# Patient Record
Sex: Male | Born: 1990 | Race: Black or African American | Hispanic: No | Marital: Married | Smoking: Never smoker
Health system: Southern US, Community
[De-identification: ages and names within clinical notes are randomized; demographics above are authoritative.]

## PROBLEM LIST (undated history)

## (undated) DIAGNOSIS — I10 Essential (primary) hypertension: Secondary | ICD-10-CM

## (undated) DIAGNOSIS — E119 Type 2 diabetes mellitus without complications: Secondary | ICD-10-CM

## (undated) DIAGNOSIS — L02512 Cutaneous abscess of left hand: Secondary | ICD-10-CM

## (undated) HISTORY — DX: Cutaneous abscess of left hand: L02.512

---
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
## 2011-07-04 ENCOUNTER — Emergency Department (HOSPITAL_COMMUNITY)
Admission: EM | Admit: 2011-07-04 | Discharge: 2011-07-04 | Disposition: A | Discharge status: Home / Self Care | Payer: Commercial Indemnity | Attending: Emergency Medicine | Admitting: Emergency Medicine

## 2011-07-04 DIAGNOSIS — IMO0001 Reserved for inherently not codable concepts without codable children: Secondary | ICD-10-CM | POA: Insufficient documentation

## 2011-07-04 DIAGNOSIS — E119 Type 2 diabetes mellitus without complications: Secondary | ICD-10-CM | POA: Insufficient documentation

## 2011-07-04 DIAGNOSIS — R5383 Other fatigue: Secondary | ICD-10-CM | POA: Insufficient documentation

## 2011-07-04 DIAGNOSIS — R110 Nausea: Secondary | ICD-10-CM | POA: Insufficient documentation

## 2011-07-04 DIAGNOSIS — B9789 Other viral agents as the cause of diseases classified elsewhere: Secondary | ICD-10-CM | POA: Insufficient documentation

## 2011-07-04 DIAGNOSIS — I10 Essential (primary) hypertension: Secondary | ICD-10-CM | POA: Insufficient documentation

## 2011-07-04 DIAGNOSIS — R5381 Other malaise: Secondary | ICD-10-CM | POA: Insufficient documentation

## 2011-07-04 DIAGNOSIS — Z79899 Other long term (current) drug therapy: Secondary | ICD-10-CM | POA: Insufficient documentation

## 2011-07-04 LAB — URINE MICROSCOPIC-ADD ON

## 2011-07-04 LAB — DIFFERENTIAL
Basophils Absolute: 0 10*3/uL (ref 0.0–0.1)
Basophils Relative: 0 % (ref 0–1)
Monocytes Relative: 6 % (ref 3–12)
Neutro Abs: 6.8 10*3/uL (ref 1.7–7.7)
Neutrophils Relative %: 64 % (ref 43–77)

## 2011-07-04 LAB — BASIC METABOLIC PANEL
CO2: 25 mEq/L (ref 19–32)
Chloride: 100 mEq/L (ref 96–112)
Glucose, Bld: 147 mg/dL — ABNORMAL HIGH (ref 70–99)
Potassium: 3.9 mEq/L (ref 3.5–5.1)
Sodium: 136 mEq/L (ref 135–145)

## 2011-07-04 LAB — URINALYSIS, ROUTINE W REFLEX MICROSCOPIC
Glucose, UA: >1000 mg/dL — AB
Hgb urine dipstick: NEGATIVE
Specific Gravity, Urine: 1.034 — ABNORMAL HIGH (ref 1.005–1.030)
pH: 6 (ref 5.0–8.0)

## 2011-07-04 LAB — CBC
Hemoglobin: 16.3 g/dL (ref 13.0–17.0)
MCH: 30.4 pg (ref 26.0–34.0)
RBC: 5.37 MIL/uL (ref 4.22–5.81)

## 2013-05-13 ENCOUNTER — Encounter (HOSPITAL_COMMUNITY): Payer: Self-pay | Admitting: Emergency Medicine

## 2013-05-13 ENCOUNTER — Emergency Department (HOSPITAL_COMMUNITY)
Admission: EM | Admit: 2013-05-13 | Discharge: 2013-05-13 | Disposition: A | Discharge status: Home / Self Care | Payer: Commercial Indemnity | Source: Home / Self Care

## 2013-05-13 DIAGNOSIS — K648 Other hemorrhoids: Secondary | ICD-10-CM

## 2013-05-13 HISTORY — DX: Type 2 diabetes mellitus without complications: E11.9

## 2013-05-13 HISTORY — DX: Essential (primary) hypertension: I10

## 2013-05-13 MED ORDER — HYDROCORTISONE ACETATE 25 MG RE SUPP: 25.0000 mg | Freq: Two times a day (BID) | RECTAL | Status: DC

## 2013-05-13 NOTE — ED Notes (Signed)
Pt c/o rectal bleeding onset this am... Reports he noticed blood when he wiped; denies blood in the stool or toilet bowel... Also denies abd pain and no hx of hemorrhoids... Has noticed he has been constipated lately... Alert w/no signs of acute distress.

## 2013-05-13 NOTE — ED Provider Notes (Signed)
  CSN: [id number]     Arrival date & time 05/13/13  1349 History     First MD Initiated Contact with Patient 05/13/13 1407     Chief Complaint  Patient presents with  . Rectal Bleeding   (Consider location/radiation/quality/duration/timing/severity/associated sxs/prior Treatment) HPI Comments: St when wiping after BM today saw BRBPR on tissue. No pain. Has been constipating and straining. Denies abdominal pain and admits to a poor, low fiber diet   Past Medical History  Diagnosis Date  . Hypertension   . Diabetes mellitus without complication    History reviewed. No pertinent past surgical history. No family history on file. History  Substance Use Topics  . Smoking status: Never Smoker   . Smokeless tobacco: Not on file  . Alcohol Use: Yes    Review of Systems  Constitutional: Negative.   Respiratory: Negative.   Gastrointestinal: Positive for constipation and anal bleeding. Negative for nausea, vomiting, abdominal pain, diarrhea, blood in stool and rectal pain.  Genitourinary: Negative.     Allergies  Penicillins  Home Medications   Current Outpatient Rx  Name  Route  Sig  Dispense  Refill  . insulin detemir (LEVEMIR) 100 UNIT/ML injection   Subcutaneous   Inject into the skin at bedtime.         . insulin lispro (HUMALOG KWIKPEN) 100 UNIT/ML SOPN   Subcutaneous   Inject into the skin.         Marland Kitchen lisinopril (PRINIVIL,ZESTRIL) 5 MG tablet   Oral   Take 5 mg by mouth daily.         . hydrocortisone (ANUSOL-HC) 25 MG suppository   Rectal   Place 1 suppository (25 mg total) rectally 2 (two) times daily.   24 suppository   1    There were no vitals taken for this visit. Physical Exam  Nursing note and vitals reviewed. Constitutional: He is oriented to person, place, and time. He appears well-developed and well-nourished. No distress.  Eyes: Conjunctivae and EOM are normal.  Neck: Normal range of motion. Neck supple.  Pulmonary/Chest: Effort normal.   Genitourinary:  Rectal with no external lesions or bleeding.  Nl sphincter tone Internal/DRE reveals 2 cord-like structures that are tender running parallel to the rectum and are tender.  There is scant blood in the rectal vault, no stool.   Musculoskeletal: He exhibits no edema and no tenderness.  Neurological: He is alert and oriented to person, place, and time. He exhibits normal muscle tone.  Skin: Skin is warm and dry. No rash noted.  Psychiatric: He has a normal mood and affect.    ED Course   Procedures (including critical care time)  Labs Reviewed - No data to display No results found. 1. Internal bleeding hemorrhoids     MDM  Use MiraLax as directed for constipation. Limit the use for only what he needed. Colace stool softener 3 times a day Drink plenty of fluids and stay away from the fast foods, greasy foods and starchy foods. Read instructions on diet for hemorrhoids. Anusol a.c. suppositories twice a day No more straining.  Hayden Rasmussen, NP 05/13/13 [phone]

## 2013-05-13 NOTE — ED Provider Notes (Signed)
Medical screening examination/treatment/procedure(s) were performed by resident physician or non-physician practitioner and as supervising physician I was immediately available for consultation/collaboration.   Barkley Bruns MD.   Linna Hoff, MD 05/13/13 2018

## 2014-09-04 ENCOUNTER — Ambulatory Visit (INDEPENDENT_AMBULATORY_CARE_PROVIDER_SITE_OTHER): Payer: 59 | Admitting: Physician Assistant

## 2014-09-04 VITALS — BP 138/92 | HR 94 | Temp 98.4°F | Resp 18 | Ht 71.5 in | Wt 291.2 lb

## 2014-09-04 DIAGNOSIS — E1365 Other specified diabetes mellitus with hyperglycemia: Secondary | ICD-10-CM

## 2014-09-04 DIAGNOSIS — E1310 Other specified diabetes mellitus with ketoacidosis without coma: Secondary | ICD-10-CM

## 2014-09-04 DIAGNOSIS — E1159 Type 2 diabetes mellitus with other circulatory complications: Secondary | ICD-10-CM | POA: Insufficient documentation

## 2014-09-04 DIAGNOSIS — I10 Essential (primary) hypertension: Secondary | ICD-10-CM | POA: Insufficient documentation

## 2014-09-04 DIAGNOSIS — IMO0002 Reserved for concepts with insufficient information to code with codable children: Secondary | ICD-10-CM | POA: Insufficient documentation

## 2014-09-04 DIAGNOSIS — I152 Hypertension secondary to endocrine disorders: Secondary | ICD-10-CM | POA: Insufficient documentation

## 2014-09-04 DIAGNOSIS — E1165 Type 2 diabetes mellitus with hyperglycemia: Secondary | ICD-10-CM | POA: Insufficient documentation

## 2014-09-04 DIAGNOSIS — IMO0001 Reserved for inherently not codable concepts without codable children: Secondary | ICD-10-CM

## 2014-09-04 LAB — POCT GLYCOSYLATED HEMOGLOBIN (HGB A1C): Hemoglobin A1C: 9.1

## 2014-09-04 LAB — POCT URINALYSIS DIPSTICK
BILIRUBIN UA: NEGATIVE
GLUCOSE UA: 500
KETONES UA: NEGATIVE
Leukocytes, UA: NEGATIVE
NITRITE UA: NEGATIVE
Protein, UA: NEGATIVE
RBC UA: NEGATIVE
SPEC GRAV UA: 1.02
Urobilinogen, UA: 0.2
pH, UA: 7

## 2014-09-04 LAB — COMPLETE METABOLIC PANEL WITH GFR
ALBUMIN: 4.6 g/dL (ref 3.5–5.2)
ALT: 31 U/L (ref 0–53)
AST: 21 U/L (ref 0–37)
Alkaline Phosphatase: 100 U/L (ref 39–117)
BUN: 13 mg/dL (ref 6–23)
CALCIUM: 10.7 mg/dL — AB (ref 8.4–10.5)
CO2: 31 mEq/L (ref 19–32)
Chloride: 101 mEq/L (ref 96–112)
Creat: 0.95 mg/dL (ref 0.50–1.35)
GFR, Est African American: >89 mL/min
GFR, Est Non African American: >89 mL/min
Glucose, Bld: 57 mg/dL — ABNORMAL LOW (ref 70–99)
POTASSIUM: 3.9 meq/L (ref 3.5–5.3)
Sodium: 140 mEq/L (ref 135–145)
TOTAL PROTEIN: 7.8 g/dL (ref 6.0–8.3)
Total Bilirubin: 0.7 mg/dL (ref 0.2–1.2)

## 2014-09-04 LAB — LIPID PANEL
Cholesterol: 179 mg/dL (ref 0–200)
HDL: 59 mg/dL (ref >39)
LDL Cholesterol: 100 mg/dL — ABNORMAL HIGH (ref 0–99)
Total CHOL/HDL Ratio: 3 Ratio
Triglycerides: 98 mg/dL (ref <150)
VLDL: 20 mg/dL (ref 0–40)

## 2014-09-04 LAB — MICROALBUMIN, URINE: MICROALB UR: 0.9 mg/dL (ref <2.0)

## 2014-09-04 LAB — INSULIN, RANDOM: Insulin: 247.9 u[IU]/mL — ABNORMAL HIGH (ref 2.0–19.6)

## 2014-09-04 MED ORDER — METFORMIN HCL 500 MG PO TABS: ORAL_TABLET | ORAL | Status: DC

## 2014-09-04 MED ORDER — INSULIN LISPRO 100 UNIT/ML (KWIKPEN): 27.0000 [IU] | PEN_INJECTOR | Freq: Three times a day (TID) | SUBCUTANEOUS | Status: DC | PRN

## 2014-09-04 MED ORDER — INSULIN DETEMIR 100 UNIT/ML FLEXPEN: 50.0000 [IU] | PEN_INJECTOR | Freq: Every day | SUBCUTANEOUS | Status: DC

## 2014-09-04 MED ORDER — LISINOPRIL 10 MG PO TABS: 10.0000 mg | ORAL_TABLET | Freq: Every day | ORAL | Status: DC

## 2014-09-04 NOTE — Progress Notes (Signed)
MRN: [id number] DOB: [birth year]  Subjective:   Chief Complaint  Patient presents with  . Diabetes    need to check  fasting glucose  . Medication Refill    insulin      HPI  Daniel Massey is a 23 y.o. male presenting for diabetes recheck and medication refill.   Diabetic Hx: "I am type 1"..."I do not know if I am DM 1 or 2" He was diagnosed 3 years ago, while at home on winter-break.  Mother noticed excessive urination, and checked his blood glucose with her own glucometer, and when the glucose would not register, she brought him to be seen.  He was first given metformin, which worked initially, but was failing after a few short months.  After raising it to its highest dose, along with invokana, he was given insulin.  He self-diagnosed himself with type 1, because he was told he was not "producing any insulin".  He endorses that after being diagnosed with DM, he did not make any lifestyle modifications by way of diet or exercise.   He states that he was going to [facility] in [place] for his diabetes management when his insurance ended 5 months ago.  He has not had a recheck since that time.  His last a1c was around 10.   Currently... He takes his morning glucose about 5x/week.  It runs  at 170-250.  He skips breakfast, and 5 hrs later, he uses his sliding scale depending on what that morning glucose was.  He does not check the blood glucose at any other time.  He takes Levemir at night of 50 units.  He uses the humalog with a sliding scale that is usually around 27-32 units.  He does not incorporate the actual meal or carb counting into his sliding scale assessment.  When asked about this, he states he does not know how to do this.  He denies nausea/vomiting, tremulousness, dizziness, diarrhea, or polyuria.  He does endorse some blurriness at times, though not at present moment.       His diet consist of pizza, spaghetti, and sandwhiches... "alot of carbs and starches".  He eats  dinner around 8pm and sleeps around 12am-1am.  He is not exercising.  He works at a [employer] and is generally sedentary.  He no longer takes the lisinopril for months, and had told his provider this.  He denies a hx of HTN.  He denies chest pains, palpitations, or leg swelling.      Sleep: Sleeps through the night and denies any fatigue. Etoh: 1 pt. of hard liquor/week while out one night during the weekend 1 sitting.  Prior to Admission medications   Medication Sig Start Date End Date Taking? Authorizing Provider  insulin detemir (LEVEMIR) 100 UNIT/ML injection Inject into the skin every morning.    Yes Historical Provider, MD  insulin lispro (HUMALOG KWIKPEN) 100 UNIT/ML SOPN Inject into the skin.   Yes Historical Provider, MD  lisinopril (PRINIVIL,ZESTRIL) 5 MG tablet Take 5 mg by mouth daily.    Historical Provider, MD    Allergies  Allergen Reactions  . Penicillins     Past Medical History  Diagnosis Date  . Hypertension   . Diabetes mellitus without complication    History   Social History  . Marital Status: Single    Spouse Name: N/A    Number of Children: N/A  . Years of Education: N/A   Social History Main Topics  . Smoking status: Never  Smoker   . Smokeless tobacco: None  . Alcohol Use: 1.2 oz/week    2 Cans of beer per week  . Drug Use: No  . Sexual Activity: Yes   Other Topics Concern  . None   Social History Narrative   Surgical History  No hx of surgical treatment.  ROS ROS otherwise unremarkable unless listed above.     Objective:   Vitals: BP 138/92 mmHg  Pulse 94  Temp(Src) 98.4 F (36.9 C) (Oral)  Resp 18  Ht 5' 11.5" (1.816 m)  Wt 291 lb 3.2 oz (132.087 kg)  BMI 40.05 kg/m2  SpO2 99%  Physical Exam  Constitutional: He is oriented to person, place, and time and well-developed, well-nourished, and in no distress. Vital signs are normal. He appears not lethargic. No distress. He is not intubated.  HENT:  Head: Normocephalic and  atraumatic.  Eyes: Conjunctivae and EOM are normal. Pupils are equal, round, and reactive to light.  Fundoscopic exam:      The right eye shows no AV nicking, no hemorrhage and no papilledema.       The left eye shows no AV nicking, no hemorrhage and no papilledema.  No abnormalities detected on fundoscopic exam  Cardiovascular: Normal rate, regular rhythm and normal heart sounds.  Exam reveals no gallop, no distant heart sounds and no friction rub.   No murmur heard. Pulmonary/Chest: Effort normal and breath sounds normal. No accessory muscle usage. No apnea. He is not intubated. No respiratory distress. He has no decreased breath sounds. He has no wheezes. He has no rhonchi.  Abdominal: Normal appearance.  Lymphadenopathy:    He has no cervical adenopathy.  Neurological: He is alert and oriented to person, place, and time. He has normal motor skills. He appears not lethargic.  Skin: He is not diaphoretic.    Diabetic Foot Exam - Simple   Simple Foot Form  Diabetic Foot exam was performed with the following findings:  Yes 09/04/2014 12:28 PM  Visual Inspection  No deformities, no ulcerations, no other skin breakdown bilaterally:  Yes  Sensation Testing  Intact to touch and monofilament testing bilaterally:  Yes  Pulse Check  Posterior Tibialis and Dorsalis pulse intact bilaterally:  Yes  Comments      Results for orders placed or performed in visit on 09/04/14  POCT urinalysis dipstick  Result Value Ref Range   Color, UA yellow    Clarity, UA clear    Glucose, UA 500    Bilirubin, UA neg    Ketones, UA neg    Spec Grav, UA 1.020    Blood, UA neg    pH, UA 7.0    Protein, UA neg    Urobilinogen, UA 0.2    Nitrite, UA neg    Leukocytes, UA Negative   POCT glycosylated hemoglobin (Hb A1C)  Result Value Ref Range   Hemoglobin A1C 9.1      Assessment and Plan :  23 year old male with PMH of diabetes is here for medication refill and diabetes recheck.  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  Discussed at length his uncontrolled diabetes and the dangers of not controlling blood glucose and incorporating lifestyle modifications.  There is lack of diabetes education as well as insulin use, and he would be a great candidate for nutrition and insulin counseling.  - Referral for mandatory appointment within 2 weeks with [facility] PCP at [facility]  to help manage/facilitate DM. -I am placing him on metformin 500mg  increasing every three days, so that by the time he sees provider he will be at 500mg  2 tablets, BID.  Instructed patient to check his glucose at least 3x/day with meals, especially with the addition of metformin.  Patient agrees to this plan. -Advised patient to recheck his glucose when he also feels dizzy/tremulous,etc.  Advised to have glucagon tablets for these episodes.  He confirms that he has them. -Advised patient verbally and written handout about diabetic nutrition/exercise/sugary etoh intake while awaiting nutrition/pcp appointment.    POCT glycosylated hemoglobin (Hb A1C)-9.1 most likely an average of high glucose and hypoglycemic episodes.  Recheck in 3 mos.     Glutamic acid decarboxylase auto abs, Insulin, random,  Microalbumin, urine,  Ambulatory referral to diabetic education,  HM Diabetes Foot Exam,  metFORMIN (GLUCOPHAGE) 500 MG tablet,  lisinopril (PRINIVIL,ZESTRIL) 10 MG tablet,  Insulin Detemir (LEVEMIR) 100 UNIT/ML Pen,  insulin lispro (HUMALOG KWIKPEN) 100 UNIT/ML KiwkPen POCT urinalysis dipstick,  COMPLETE METABOLIC PANEL WITH GFR,   Obesity, Class III, BMI 40-49.9 (morbid obesity)  Lipid panel,  Ambulatory referral to diabetic education -Agrees to incorporate a few days of 30 minute exercise per week.  Essential hypertension  Ambulatory referral to diabetic education, Increasing lisinopril (PRINIVIL,ZESTRIL) 10 MG tablet, as this is no longer a protective  med, but for BP as well.  Trena PlattStephanie Jamell Laymon, PA-C [facility] 11/23/201511:22 AM

## 2014-09-04 NOTE — Patient Instructions (Signed)
Diabetes and Exercise °Exercising regularly is important. It is not just about losing weight. It has many health benefits, such as: °· Improving your overall fitness, flexibility, and endurance. °· Increasing your bone density. °· Helping with weight control. °· Decreasing your body fat. °· Increasing your muscle strength. °· Reducing stress and tension. °· Improving your overall health. °People with diabetes who exercise gain additional benefits because exercise: °· Reduces appetite. °· Improves the body's use of blood sugar (glucose). °· Helps lower or control blood glucose. °· Decreases blood pressure. °· Helps control blood lipids (such as cholesterol and triglycerides). °· Improves the body's use of the hormone insulin by: °¨ Increasing the body's insulin sensitivity. °¨ Reducing the body's insulin needs. °· Decreases the risk for heart disease because exercising: °¨ Lowers cholesterol and triglycerides levels. °¨ Increases the levels of good cholesterol (such as high-density lipoproteins [HDL]) in the body. °¨ Lowers blood glucose levels. °YOUR ACTIVITY PLAN  °Choose an activity that you enjoy and set realistic goals. Your health care provider or diabetes educator can help you make an activity plan that works for you. Exercise regularly as directed by your health care provider. This includes: °· Performing resistance training twice a week such as push-ups, sit-ups, lifting weights, or using resistance bands. °· Performing 150 minutes of cardio exercises each week such as walking, running, or playing sports. °· Staying active and spending no more than 90 minutes at one time being inactive. °Even short bursts of exercise are good for you. Three 10-minute sessions spread throughout the day are just as beneficial as a single 30-minute session. Some exercise ideas include: °· Taking the dog for a walk. °· Taking the stairs instead of the elevator. °· Dancing to your favorite song. °· Doing an exercise  video. °· Doing your favorite exercise with a friend. °RECOMMENDATIONS FOR EXERCISING WITH TYPE 1 OR TYPE 2 DIABETES  °· Check your blood glucose before exercising. If blood glucose levels are greater than 240 mg/dL, check for urine ketones. Do not exercise if ketones are present. °· Avoid injecting insulin into areas of the body that are going to be exercised. For example, avoid injecting insulin into: °¨ The arms when playing tennis. °¨ The legs when jogging. °· Keep a record of: °¨ Food intake before and after you exercise. °¨ Expected peak times of insulin action. °¨ Blood glucose levels before and after you exercise. °¨ The type and amount of exercise you have done. °· Review your records with your health care provider. Your health care provider will help you to develop guidelines for adjusting food intake and insulin amounts before and after exercising. °· If you take insulin or oral hypoglycemic agents, watch for signs and symptoms of hypoglycemia. They include: °¨ Dizziness. °¨ Shaking. °¨ Sweating. °¨ Chills. °¨ Confusion. °· Drink plenty of water while you exercise to prevent dehydration or heat stroke. Body water is lost during exercise and must be replaced. °· Talk to your health care provider before starting an exercise program to make sure it is safe for you. Remember, almost any type of activity is better than none. °Document Released: 12/21/2003 Document Revised: 02/14/2014 Document Reviewed: 03/09/2013 °ExitCare® Patient Information ©2015 [employer]. This information is not intended to replace advice given to you by your health care provider. Make sure you discuss any questions you have with your health care provider. ° ° °Blood Glucose Monitoring °Monitoring your blood glucose (also know as blood sugar) helps you to manage your diabetes. It   also helps you and your health care provider monitor your diabetes and determine how well your treatment plan is working. °WHY SHOULD YOU MONITOR YOUR BLOOD  GLUCOSE? °· It can help you understand how food, exercise, and medicine affect your blood glucose. °· It allows you to know what your blood glucose is at any given moment. You can quickly tell if you are having low blood glucose (hypoglycemia) or high blood glucose (hyperglycemia). °· It can help you and your health care provider know how to adjust your medicines. °· It can help you understand how to manage an illness or adjust medicine for exercise. °WHEN SHOULD YOU TEST? °Your health care provider will help you decide how often you should check your blood glucose. This may depend on the type of diabetes you have, your diabetes control, or the types of medicines you are taking. Be sure to write down all of your blood glucose readings so that this information can be reviewed with your health care provider. See below for examples of testing times that your health care provider may suggest. °Type 1 Diabetes °· Test 4 times a day if you are in good control, using an insulin pump, or perform multiple daily injections. °· If your diabetes is not well controlled or if you are sick, you may need to monitor more often. °· It is a good idea to also monitor: °¨ Before and after exercise. °¨ Between meals and 2 hours after a meal. °¨ Occasionally between 2:00 a.m. and 3:00 a.m. °Type 2 Diabetes °· It can vary with each person, but generally, if you are on insulin, test 4 times a day. °· If you take medicines by mouth (orally), test 2 times a day. °· If you are on a controlled diet, test once a day. °· If your diabetes is not well controlled or if you are sick, you may need to monitor more often. °HOW TO MONITOR YOUR BLOOD GLUCOSE °Supplies Needed °· Blood glucose meter. °· Test strips for your meter. Each meter has its own strips. You must use the strips that go with your own meter. °· A pricking needle (lancet). °· A device that holds the lancet (lancing device). °· A journal or log book to write down your  results. °Procedure °· Wash your hands with soap and water. Alcohol is not preferred. °· Prick the side of your finger (not the tip) with the lancet. °· Gently milk the finger until a small drop of blood appears. °· Follow the instructions that come with your meter for inserting the test strip, applying blood to the strip, and using your blood glucose meter. °Other Areas to Get Blood for Testing °Some meters allow you to use other areas of your body (other than your finger) to test your blood. These areas are called alternative sites. The most common alternative sites are: °· The forearm. °· The thigh. °· The back area of the lower leg. °· The palm of the hand. °The blood flow in these areas is slower. Therefore, the blood glucose values you get may be delayed, and the numbers are different from what you would get from your fingers. Do not use alternative sites if you think you are having hypoglycemia. Your reading will not be accurate. Always use a finger if you are having hypoglycemia. Also, if you cannot feel your lows (hypoglycemia unawareness), always use your fingers for your blood glucose checks. °ADDITIONAL TIPS FOR GLUCOSE MONITORING °· Do not reuse lancets. °· Always carry your supplies   with you. °· All blood glucose meters have a 24-hour "hotline" number to call if you have questions or need help. °· Adjust (calibrate) your blood glucose meter with a control solution after finishing a few boxes of strips. °BLOOD GLUCOSE RECORD KEEPING °It is a good idea to keep a daily record or log of your blood glucose readings. Most glucose meters, if not all, keep your glucose records stored in the meter. Some meters come with the ability to download your records to your home computer. Keeping a record of your blood glucose readings is especially helpful if you are wanting to look for patterns. Make notes to go along with the blood glucose readings because you might forget what happened at that exact time. Keeping  good records helps you and your health care provider to work together to achieve good diabetes management.  °Document Released: 10/03/2003 Document Revised: 02/14/2014 Document Reviewed: 02/22/2013 °ExitCare® Patient Information ©2015 [employer]. This information is not intended to replace advice given to you by your health care provider. Make sure you discuss any questions you have with your health care provider. ° °

## 2014-09-05 ENCOUNTER — Encounter: Payer: Self-pay | Admitting: Physician Assistant

## 2014-09-06 NOTE — Progress Notes (Signed)
The patient was discussed with me and I agree with the diagnosis and treatment plan.  

## 2014-09-07 LAB — GLUTAMIC ACID DECARBOXYLASE AUTO ABS: GLUTAMIC ACID DECARB AB: 5.1 U/mL — AB (ref <=1.0)

## 2014-09-16 ENCOUNTER — Other Ambulatory Visit: Payer: Self-pay | Admitting: Physician Assistant

## 2014-09-16 DIAGNOSIS — IMO0001 Reserved for inherently not codable concepts without codable children: Secondary | ICD-10-CM

## 2014-09-16 DIAGNOSIS — E1365 Other specified diabetes mellitus with hyperglycemia: Principal | ICD-10-CM

## 2014-09-20 ENCOUNTER — Telehealth: Payer: Self-pay

## 2014-09-20 NOTE — Telephone Encounter (Signed)
Patient has questions regarding him being referred to another doctor. Patient stated he received a call from another office stating our office referred him. Looks like he was referred to a nutritions and diabetic management center and to [facility]. Please call patient back at [phone]

## 2014-09-21 NOTE — Telephone Encounter (Signed)
Spoke to pt he understands referrals sent for uncontrolled DM

## 2014-10-05 ENCOUNTER — Telehealth: Payer: Self-pay | Admitting: Physician Assistant

## 2014-10-05 NOTE — Telephone Encounter (Signed)
Spoke with patient.  He states that he continues to take the insulin.  He is doing a morning glucose that is about 120-150 in the morning.  Blood sugars are around 180-200s hours after meal.  Patient is not taking th metformin at this time due to cost.  He had a large deductible ($500) that he had to pay, and was not able to pay for the additional medication.  He said he was going to start the metformin in January.  Appointment with [facility] is 10/20/2014.  Advised patient to wait until visit where they might advise different medication regimen.  States that he has changed cutting back on bread, fried foods.  Trying to get in vegetables in the morning.  Will contact him following the visit.  Also advised patient to contact financial assistance.

## 2014-10-18 ENCOUNTER — Ambulatory Visit: Payer: Commercial Indemnity | Admitting: Endocrinology

## 2014-10-19 ENCOUNTER — Telehealth: Payer: Self-pay | Admitting: Physician Assistant

## 2014-10-19 DIAGNOSIS — E1365 Other specified diabetes mellitus with hyperglycemia: Principal | ICD-10-CM

## 2014-10-19 DIAGNOSIS — IMO0001 Reserved for inherently not codable concepts without codable children: Secondary | ICD-10-CM

## 2014-10-19 NOTE — Telephone Encounter (Signed)
Patient states that he is having trouble getting his Humalog script. His insurance only covers Novalog products.  [phone]

## 2014-10-19 NOTE — Telephone Encounter (Signed)
Daniel CornfieldStephanie, do you want to send in Rx for the equivalent Novolog Flexpen? I have pended w/same sig and quantity as on the Humalog Rx.

## 2014-10-20 ENCOUNTER — Telehealth: Payer: Self-pay

## 2014-10-20 ENCOUNTER — Ambulatory Visit: Payer: Commercial Indemnity | Admitting: *Deleted

## 2014-10-20 NOTE — Telephone Encounter (Signed)
Patient called to check the status of his medication "Humalog". I informed patient we were waiting on the provider to see if she would ok switching it to "Novolog Flexpen". Also the provider wanted to know what his [facility] advised since he had an appointment with them today. Per patient he had to rescheduled that appointment to 11/11/14. Patient is completely out and is ok with the medication being changed if authorized by provider. Patients call back number is [phone] 

## 2014-10-20 NOTE — Telephone Encounter (Signed)
Patient appointment with [facility] today, 10/20/2014.  Please ask what endocrinology advised him to take going forward now.

## 2014-10-21 ENCOUNTER — Other Ambulatory Visit: Payer: Self-pay

## 2014-10-21 DIAGNOSIS — IMO0001 Reserved for inherently not codable concepts without codable children: Secondary | ICD-10-CM

## 2014-10-21 DIAGNOSIS — E1365 Other specified diabetes mellitus with hyperglycemia: Principal | ICD-10-CM

## 2014-10-21 MED ORDER — INSULIN LISPRO 100 UNIT/ML ~~LOC~~ SOLN: SUBCUTANEOUS | Status: DC

## 2014-10-21 MED ORDER — INSULIN SYRINGES (DISPOSABLE) U-100 0.5 ML MISC: Status: DC

## 2014-10-21 NOTE — Telephone Encounter (Addendum)
Patient called back to check the status of his medication being filled. Per patient he is completely out and is ok with "Novolog Flexpen being called in to [facility] [place]. I spoke with Elease EtienneSara hansen LPN and she stated she would pass this message on to [facility] try to get it called in for patient. Patient made aware

## 2014-10-21 NOTE — Telephone Encounter (Signed)
Duplicate message- attached information to other message

## 2014-10-21 NOTE — Telephone Encounter (Signed)
For now, stay with Humalog. Can use vials. Please send vials and supplies (syringes, needles, alcohol prep pads) for same dose as was using with [facility] [employer].

## 2014-10-21 NOTE — Telephone Encounter (Signed)
Malissa HippoSara A Tkai Large, LPN at 1/6/10961/05/2015 9:02 AM     Status: Signed       Expand All Collapse All   Patient called to check the status of his medication "Humalog". I informed patient we were waiting on the provider to see if she would ok switching it to "Novolog Flexpen". Also the provider wanted to know what his [place] endocrinology advised since he had an appointment with them today. Per patient he had to rescheduled that appointment to 11/11/14. Patient is completely out and is ok with the medication being changed if authorized by provider. Patients call back number is [phone]            Garnetta BuddyStephanie D English, PA at 10/20/2014 3:29 PM     Status: Signed       Expand All Collapse All   Patient appointment with [facility] today, 10/20/2014. Please ask what endocrinology advised him to take going forward now.             Vicenta DunningBarbara A Briggs, RN at 10/19/2014 4:39 PM     Status: Signed       Expand All Collapse All   Judeth CornfieldStephanie, do you want to send in Rx for the equivalent Novolog Flexpen? I have pended w/same sig and quantity as on the Humalog Rx.            Jasmine L Dunlap at 10/19/2014 1:08 PM     Status: Signed       Expand All Collapse All   Patient states that he is having trouble getting his Humalog script. His insurance only covers Novalog products.  [phone]

## 2014-10-21 NOTE — Telephone Encounter (Signed)
Pt notified and rx's sent into pharmacy

## 2014-10-21 NOTE — Telephone Encounter (Signed)
Patient called to check the status of his medication "Humalog". I informed patient we were waiting on the provider to see if she would ok switching it to "Novolog Flexpen". Also the provider wanted to know what his [place] endocrinology advised since he had an appointment with them today. Per patient he had to rescheduled that appointment to 11/11/14. Patient is completely out and is ok with the medication being changed if authorized by provider. Patients call back number is [phone]

## 2014-10-22 ENCOUNTER — Telehealth: Payer: Self-pay

## 2014-10-22 NOTE — Telephone Encounter (Signed)
Patient says we did a prior auth on his humlalog, but it was for the wrong kind???

## 2014-10-23 MED ORDER — INSULIN ASPART 100 UNIT/ML FLEXPEN: 27.0000 [IU] | PEN_INJECTOR | Freq: Three times a day (TID) | SUBCUTANEOUS | Status: DC

## 2014-10-23 NOTE — Telephone Encounter (Signed)
Pt would like the pens.

## 2014-10-23 NOTE — Telephone Encounter (Signed)
Pt says his ins will not cover Humalog but they will Novolog. Can we switch him? Pt is out of insulin.  [facility] Takes 20-30 U at mealtime

## 2014-10-23 NOTE — Telephone Encounter (Signed)
I misunderstood-thought they'd pay for Humalog, just not the Kwikpen.  OK to change to Novolog. How does he want it? Vials or pen?  Send it in, with the same instructions as the Humalog, with enough to last until his 11/11/2014 visit with endocrinology.

## 2014-10-23 NOTE — Telephone Encounter (Signed)
Sent in

## 2014-10-24 NOTE — Telephone Encounter (Signed)
Pt LM on VM stating there is still a problem. Called [facility] and they reported it was transferred to the [facility] loc, but he checked coverage and advised it is not covered bc pt needs to pay his premium. Called and advised pt of this and he stated that he thinks that pharm just doesn't have his new ins info and he will contact them. I advised that pharm should have Rxs for both humalog and novolog, so he should be able to get one of them covered by ins. He will CB if still has a problem.

## 2014-10-31 ENCOUNTER — Ambulatory Visit (INDEPENDENT_AMBULATORY_CARE_PROVIDER_SITE_OTHER): Payer: BLUE CROSS/BLUE SHIELD | Admitting: Physician Assistant

## 2014-10-31 VITALS — BP 118/76 | HR 78 | Temp 98.3°F | Resp 16 | Ht 72.0 in | Wt 290.0 lb

## 2014-10-31 DIAGNOSIS — J019 Acute sinusitis, unspecified: Secondary | ICD-10-CM

## 2014-10-31 MED ORDER — HYDROCOD POLST-CHLORPHEN POLST 10-8 MG/5ML PO LQCR: 5.0000 mL | Freq: Two times a day (BID) | ORAL | Status: DC | PRN

## 2014-10-31 MED ORDER — CLARITHROMYCIN ER 500 MG PO TB24: 1000.0000 mg | ORAL_TABLET | Freq: Every day | ORAL | Status: AC

## 2014-10-31 MED ORDER — IPRATROPIUM BROMIDE 0.03 % NA SOLN: 2.0000 | Freq: Two times a day (BID) | NASAL | Status: DC

## 2014-10-31 NOTE — Patient Instructions (Signed)
Get plenty of rest and drink at least 64 ounces of water daily. 

## 2014-10-31 NOTE — Progress Notes (Signed)
Subjective:    Patient ID: Daniel Massey, male    DOB: 11-Jan-1991, 24 y.o.   MRN: [id number]   PCP: No PCP Per Patient  Chief Complaint  Patient presents with  . Sore Throat  . Headache  . Cough    yellow mucus    Allergies  Allergen Reactions  . Penicillins     unknown    Patient Active Problem List   Diagnosis Date Noted  . DM (diabetes mellitus), type 2, uncontrolled 09/04/2014  . Obesity, Class III, BMI 40-49.9 (morbid obesity) 09/04/2014  . Essential hypertension 09/04/2014    Prior to Admission medications   Medication Sig Start Date End Date Taking? Authorizing Provider  insulin aspart (NOVOLOG FLEXPEN) 100 UNIT/ML FlexPen Inject 27-32 Units into the skin 3 (three) times daily with meals. 10/23/14  Yes Dusan Lipford S Tanishka Drolet, PA-C  Insulin Detemir (LEVEMIR) 100 UNIT/ML Pen Inject 50 Units into the skin daily at 10 pm. 09/04/14  Yes Judeth Cornfield D English, PA  Insulin Syringes, Disposable, U-100 0.5 ML MISC Use as directed 3 times a day with Humalog insulin 10/21/14  Yes Shadawn Hanaway S Hubbard Seldon, PA-C  lisinopril (PRINIVIL,ZESTRIL) 10 MG tablet Take 1 tablet (10 mg total) by mouth daily. Patient not taking: Reported on 10/31/2014 09/04/14   Collie Siad English, PA  metFORMIN (GLUCOPHAGE) 500 MG tablet 1 PO QD x3days increase to 1 PO BID for 3 days, then 2 BID Patient not taking: Reported on 10/31/2014 09/04/14   Garnetta Buddy, PA    Medical, Surgical, Family and Social History reviewed and updated.  HPI  A couple of weeks ago, he was really congested, like now. Coughing a lot, mucous coming up. Then it went away. Then recurred about 4 days ago. Unable to smell. Voice is scratchy. Minimal sore throat. Cough wakes him up at night, but isn't so bad during the day. HA.  Facial pressure. Nasal drainage is yellow. Feels cold, but no measured fever. No GI/GU symptoms. OTC cold and flu meds, Mucinex with temporary relief of symptoms.  Review of Systems As above.    Objective:   Physical Exam  Constitutional: He is oriented to person, place, and time. Vital signs are normal. He appears well-developed and well-nourished. He is active. No distress.  BP 118/76 mmHg  Pulse 78  Temp(Src) 98.3 F (36.8 C) (Oral)  Resp 16  Ht 6' (1.829 m)  Wt 290 lb (131.543 kg)  BMI 39.32 kg/m2  SpO2 99%   HENT:  Head: Normocephalic and atraumatic.  Right Ear: Hearing, tympanic membrane, external ear and ear canal normal.  Left Ear: Hearing, tympanic membrane, external ear and ear canal normal.  Nose: Mucosal edema and rhinorrhea present.  No foreign bodies. Right sinus exhibits maxillary sinus tenderness. Right sinus exhibits no frontal sinus tenderness. Left sinus exhibits maxillary sinus tenderness. Left sinus exhibits no frontal sinus tenderness.  Mouth/Throat: Uvula is midline, oropharynx is clear and moist and mucous membranes are normal. No uvula swelling. No oropharyngeal exudate.  Eyes: Conjunctivae and EOM are normal. Pupils are equal, round, and reactive to light. Right eye exhibits no discharge. Left eye exhibits no discharge. No scleral icterus.  Neck: Trachea normal, normal range of motion and full passive range of motion without pain. Neck supple. No thyroid mass and no thyromegaly present.  Cardiovascular: Normal rate, regular rhythm and normal heart sounds.   Pulmonary/Chest: Effort normal and breath sounds normal.  Lymphadenopathy:       Head (right side): No submandibular, no tonsillar, no  preauricular, no posterior auricular and no occipital adenopathy present.       Head (left side): No submandibular, no tonsillar, no preauricular and no occipital adenopathy present.    He has no cervical adenopathy.       Right: No supraclavicular adenopathy present.       Left: No supraclavicular adenopathy present.  Neurological: He is alert and oriented to person, place, and time. He has normal strength. No cranial nerve deficit or sensory deficit.  Skin: Skin is warm, dry and  intact. No rash noted.  Psychiatric: He has a normal mood and affect. His speech is normal and behavior is normal.          Assessment & Plan:  1. Acute sinusitis, recurrence not specified, unspecified location Rest. Fluids. OK to continue Mucinex. Stop nasal decongestant spray. - clarithromycin (BIAXIN XL) 500 MG 24 hr tablet; Take 2 tablets (1,000 mg total) by mouth daily.  Dispense: 20 tablet; Refill: 0 - ipratropium (ATROVENT) 0.03 % nasal spray; Place 2 sprays into both nostrils 2 (two) times daily.  Dispense: 30 mL; Refill: 0 - chlorpheniramine-HYDROcodone (TUSSIONEX PENNKINETIC ER) 10-8 MG/5ML LQCR; Take 5 mLs by mouth every 12 (twelve) hours as needed for cough (cough).  Dispense: 100 mL; Refill: 0  Return if symptoms worsen or fail to improve.   Fernande Brashelle S. Jamilynn Whitacre, PA-C Physician Assistant-Certified [facility]

## 2014-11-03 ENCOUNTER — Ambulatory Visit: Payer: Commercial Indemnity | Admitting: Endocrinology

## 2014-11-11 ENCOUNTER — Ambulatory Visit: Payer: Commercial Indemnity | Admitting: *Deleted

## 2014-11-23 ENCOUNTER — Telehealth: Payer: Self-pay | Admitting: Physician Assistant

## 2014-11-23 DIAGNOSIS — I10 Essential (primary) hypertension: Secondary | ICD-10-CM

## 2014-11-23 DIAGNOSIS — IMO0001 Reserved for inherently not codable concepts without codable children: Secondary | ICD-10-CM

## 2014-11-23 DIAGNOSIS — E1365 Other specified diabetes mellitus with hyperglycemia: Secondary | ICD-10-CM

## 2014-11-24 MED ORDER — LISINOPRIL 10 MG PO TABS: 10.0000 mg | ORAL_TABLET | Freq: Every day | ORAL | Status: DC

## 2014-11-24 NOTE — Telephone Encounter (Signed)
Pt states that he can not get into an endocrinologist until March, and would like to know if we can refill his blood pressure medication until then. Please advise

## 2014-11-24 NOTE — Telephone Encounter (Signed)
RFd both lisinopril and novolog pharm req'd. Notified pt.

## 2014-11-25 ENCOUNTER — Ambulatory Visit: Payer: Commercial Indemnity | Admitting: Endocrinology

## 2014-12-07 ENCOUNTER — Telehealth: Payer: Self-pay

## 2014-12-08 MED ORDER — INSULIN ASPART 100 UNIT/ML FLEXPEN: PEN_INJECTOR | SUBCUTANEOUS | Status: DC

## 2014-12-08 NOTE — Telephone Encounter (Signed)
Pt stated that he has new ins starting in April and he won't have benefits for a while in between. He asked that I send in another RF of Novolog to cover him until his new ins starts. Sent the RF. Pt also reported that he is still planning to see the ENDO on 3/14.

## 2014-12-08 NOTE — Telephone Encounter (Signed)
Daniel Massey called to speak to LindseyBarbara only. Please advise

## 2014-12-26 ENCOUNTER — Ambulatory Visit: Payer: Commercial Indemnity | Admitting: Endocrinology

## 2015-01-18 ENCOUNTER — Ambulatory Visit (INDEPENDENT_AMBULATORY_CARE_PROVIDER_SITE_OTHER): Payer: BLUE CROSS/BLUE SHIELD | Admitting: Endocrinology

## 2015-01-18 ENCOUNTER — Encounter: Payer: Self-pay | Admitting: Endocrinology

## 2015-01-18 VITALS — BP 132/84 | HR 90 | Temp 98.7°F | Ht 72.0 in | Wt 284.0 lb

## 2015-01-18 DIAGNOSIS — E1365 Other specified diabetes mellitus with hyperglycemia: Secondary | ICD-10-CM

## 2015-01-18 DIAGNOSIS — IMO0001 Reserved for inherently not codable concepts without codable children: Secondary | ICD-10-CM

## 2015-01-18 MED ORDER — INSULIN DETEMIR 100 UNIT/ML FLEXPEN
70.0000 [IU] | PEN_INJECTOR | Freq: Every day | SUBCUTANEOUS | Status: DC
Start: 2015-01-18 — End: 2015-02-17

## 2015-01-18 MED ORDER — INSULIN ASPART 100 UNIT/ML FLEXPEN: 40.0000 [IU] | PEN_INJECTOR | Freq: Three times a day (TID) | SUBCUTANEOUS | Status: DC

## 2015-01-18 NOTE — Patient Instructions (Addendum)
good diet and exercise significantly improve the control of your diabetes.  please let me know if you wish to be referred to a dietician.  high blood sugar is very risky to your health.  you should see an eye doctor and dentist every year.  It is very important to get all recommended vaccinations.  controlling your blood pressure and cholesterol drastically reduces the damage diabetes does to your body.  Those who smoke should quit.  please discuss these with your doctor.  check your blood sugar twice a day.  vary the time of day when you check, between before the 3 meals, and at bedtime.  also check if you have symptoms of your blood sugar being too high or too low.  please keep a record of the readings and bring it to your next appointment here.  You can write it on any piece of paper.  please call us sooner if your blood sugar goes below 70, or if you have a lot of readings over 200.  Please consider having weight loss surgery.  It is good for your health.  Here is some information about it.  If you decide to consider further, please call the phone number in the papers, and register for a free informational meeting.   For now, please increase the insulin to the numbers noted on this page.  Please come back for a follow-up appointment in 1 month.

## 2015-01-18 NOTE — Progress Notes (Signed)
Subjective:    Patient ID: Daniel Massey, male    DOB: 03-18-1991, 24 y.o.   MRN: [id number]  HPI pt states DM was dx'ed in 2011; he has mild if any neuropathy of the lower extremities; he is unaware of any associated chronic complications; he has been on insulin since 2012; pt says his diet and exercise are not good; he has never had pancreatitis, severe hypoglycemia or DKA.  He takes multiple daily injections.  Pt says he misses approx 1 insulin dose every other day.  He says he was off insulin due to a gap in his insurance, but has been back on for the past month.  Since on the insulin, cbg's have been in the 200's.   Past Medical History  Diagnosis Date  . Hypertension   . Diabetes mellitus without complication     Past Surgical History  Procedure Laterality Date  . Orif ulnar fracture Right 08/2008    History   Social History  . Marital Status: Single    Spouse Name: n/a  . Number of Children: 0  . 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 Smoking status: Never Smoker   . Smokeless tobacco: Never Used  . Alcohol Use: 1.2 oz/week    2 Cans of beer per week  . Drug Use: No  . Sexual Activity:    Partners: Female   Other Topics Concern  . Not on file   Social History Narrative   Graduate of [employer] with a degree in Biology.   [place] in [place], [place]    Current Outpatient Prescriptions on File Prior to Visit  Medication Sig Dispense Refill  . Insulin Syringes, Disposable, U-100 0.5 ML MISC Use as directed 3 times a day with Humalog insulin 100 each 0  . ipratropium (ATROVENT) 0.03 % nasal spray Place 2 sprays into both nostrils 2 (two) times daily. 30 mL 0  . lisinopril (PRINIVIL,ZESTRIL) 10 MG tablet Take 1 tablet (10 mg total) by mouth daily. 30 tablet 2   No current facility-administered medications on file prior to visit.    Allergies  Allergen Reactions  . Penicillins       unknown    Family History  Problem Relation Age of Onset  . Diabetes Mother   . Hypertension Mother   . Diabetes Father   . Cancer Father 750    colon  . Hypertension Father     BP 132/84 mmHg  Pulse 90  Temp(Src) 98.7 F (37.1 C) (Oral)  Ht 6' (1.829 m)  Wt 284 lb (128.822 kg)  BMI 38.51 kg/m2  SpO2 98%    Review of Systems denies weight loss, blurry vision, headache, chest pain, sob, n/v, muscle cramps, excessive diaphoresis, depression, cold intolerance, rhinorrhea, and easy bruising.  He reports urinary frequency.  He seldom has hypoglycemia, and these episodes are mild.     Objective:   Physical Exam VS: see vs page GEN: no distress HEAD: head: no deformity eyes: no periorbital swelling, no proptosis external nose and ears are normal mouth: no lesion seen NECK: supple, thyroid is not enlarged CHEST WALL: no deformity LUNGS: clear to auscultation BREASTS:  bilat pseudogynecomastia CV: reg rate and rhythm, no murmur ABD: abdomen is soft, nontender.  no hepatosplenomegaly.  not distended.  no hernia MUSCULOSKELETAL: muscle bulk and strength are grossly normal.  no obvious joint swelling.  gait is normal  and steady EXTEMITIES: no deformity.  no ulcer on the feet.  feet are of normal color and temp.  no edema PULSES: dorsalis pedis intact bilat.  no carotid bruit NEURO:  cn 2-12 grossly intact.   readily moves all 4's.  sensation is intact to touch on the feet SKIN:  Normal texture and temperature.  No rash or suspicious lesion is visible.   NODES:  None palpable at the neck PSYCH: alert, well-oriented.  Does not appear anxious nor depressed.   Lab Results  Component Value Date   HGBA1C 9.1 09/04/2014      Assessment & Plan:  DM: severe exacerbation Morbid obesity: new to me. HTN: well-controlled.  Please continue the same zestril.   Patient is advised the following: Patient Instructions  good diet and exercise significantly improve the control of your  diabetes.  please let me know if you wish to be referred to a dietician.  high blood sugar is very risky to your health.  you should see an eye doctor and dentist every year.  It is very important to get all recommended vaccinations.  controlling your blood pressure and cholesterol drastically reduces the damage diabetes does to your body.  Those who smoke should quit.  please discuss these with your doctor.  check your blood sugar twice a day.  vary the time of day when you check, between before the 3 meals, and at bedtime.  also check if you have symptoms of your blood sugar being too high or too low.  please keep a record of the readings and bring it to your next appointment here.  You can write it on any piece of paper.  please call us sooner if your blood sugar goes below 70, or if you have a lot of readings over 200.  Please consider having weight loss surgery.  It is good for your health.  Here is some information about it.  If you decide to consider further, please call the phone number in the papers, and register for a free informational meeting.   For now, please increase the insulin to the numbers noted on this page.  Please come back for a follow-up appointment in 1 month.

## 2015-02-17 ENCOUNTER — Encounter: Payer: Self-pay | Admitting: Endocrinology

## 2015-02-17 ENCOUNTER — Ambulatory Visit (INDEPENDENT_AMBULATORY_CARE_PROVIDER_SITE_OTHER): Payer: BLUE CROSS/BLUE SHIELD | Admitting: Endocrinology

## 2015-02-17 VITALS — BP 138/84 | HR 96 | Temp 98.7°F | Ht 72.0 in | Wt 288.0 lb

## 2015-02-17 DIAGNOSIS — E1365 Other specified diabetes mellitus with hyperglycemia: Secondary | ICD-10-CM | POA: Diagnosis not present

## 2015-02-17 DIAGNOSIS — IMO0001 Reserved for inherently not codable concepts without codable children: Secondary | ICD-10-CM

## 2015-02-17 MED ORDER — INSULIN DETEMIR 100 UNIT/ML FLEXPEN: 60.0000 [IU] | PEN_INJECTOR | Freq: Every day | SUBCUTANEOUS | Status: DC

## 2015-02-17 MED ORDER — INSULIN ASPART 100 UNIT/ML FLEXPEN: 55.0000 [IU] | PEN_INJECTOR | Freq: Three times a day (TID) | SUBCUTANEOUS | Status: DC

## 2015-02-17 NOTE — Progress Notes (Signed)
Subjective:    Patient ID: Daniel Massey, male    DOB: 01-26-1991, 24 y.o.   MRN: [id number]  HPI  Pt returns for f/u of diabetes mellitus: DM type: Insulin-requiring type 2 Dx'ed: 2011 Complications: none Therapy: insulin since 2012 DKA: never Severe hypoglycemia: never Pancreatitis: never Other: he takes multiple daily injections Interval history: no cbg record, but states cbg's vary from 100-300.  It is in general higher as the day goes on.  pt states he feels well in general. Past Medical History  Diagnosis Date  . Hypertension   . Diabetes mellitus without complication     Past Surgical History  Procedure Laterality Date  . Orif ulnar fracture Right 08/2008    History   Social History  . Marital Status: Single    Spouse Name: n/a  . Number of Children: 0  . 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 Smoking status: Never Smoker   . Smokeless tobacco: Never Used  . Alcohol Use: 1.2 oz/week    2 Cans of beer per week  . Drug Use: No  . Sexual Activity:    Partners: Female   Other Topics Concern  . Not on file   Social History Narrative   Graduate of [employer] with a degree in Biology.   [place] in [place], [place]    Current Outpatient Prescriptions on File Prior to Visit  Medication Sig Dispense Refill  . Insulin Syringes, Disposable, U-100 0.5 ML MISC Use as directed 3 times a day with Humalog insulin 100 each 0  . lisinopril (PRINIVIL,ZESTRIL) 10 MG tablet Take 1 tablet (10 mg total) by mouth daily. 30 tablet 2  . ipratropium (ATROVENT) 0.03 % nasal spray Place 2 sprays into both nostrils 2 (two) times daily. (Patient not taking: Reported on 02/17/2015) 30 mL 0   No current facility-administered medications on file prior to visit.    Allergies  Allergen Reactions  . Penicillins     unknown    Family History  Problem Relation Age of Onset  . Diabetes Mother   .  Hypertension Mother   . Diabetes Father   . Cancer Father 4750    colon  . Hypertension Father     BP 138/84 mmHg  Pulse 96  Temp(Src) 98.7 F (37.1 C) (Oral)  Ht 6' (1.829 m)  Wt 288 lb (130.636 kg)  BMI 39.05 kg/m2  SpO2 97%   Review of Systems He denies hypoglycemia.  He has gained weight    Objective:   Physical Exam VITAL SIGNS:  See vs page GENERAL: no distress Pulses: dorsalis pedis intact bilat.   MSK: no deformity of the feet CV: no leg edema Skin:  no ulcer on the feet.  normal color and temp on the feet. Neuro: sensation is intact to touch on the feet.         Assessment & Plan:  DM: he needs increased rx Obesity worse  Patient is advised the following: Patient Instructions  check your blood sugar twice a day.  vary the time of day when you check, between before the 3 meals, and at bedtime.  also check if you have symptoms of your blood sugar being too high or too low.  please keep a record of the readings and bring it to your next appointment here.  You can write it on any piece of paper.  please call  us sooner if your blood sugar goes below 70, or if you have a lot of readings over 200.   For now, please adjuste the insulin to the numbers noted on this page.  Please come back for a follow-up appointment in 2 months.  Please consider having weight loss surgery.  It is good for your health.  Here is some information about it.  If you decide to consider further, please call the phone number in the papers, and register for a free informational meeting.

## 2015-02-17 NOTE — Patient Instructions (Addendum)
check your blood sugar twice a day.  vary the time of day when you check, between before the 3 meals, and at bedtime.  also check if you have symptoms of your blood sugar being too high or too low.  please keep a record of the readings and bring it to your next appointment here.  You can write it on any piece of paper.  please call us sooner if your blood sugar goes below 70, or if you have a lot of readings over 200.   For now, please adjuste the insulin to the numbers noted on this page.  Please come back for a follow-up appointment in 2 months.  Please consider having weight loss surgery.  It is good for your health.  Here is some information about it.  If you decide to consider further, please call the phone number in the papers, and register for a free informational meeting.

## 2015-05-01 ENCOUNTER — Ambulatory Visit: Payer: BLUE CROSS/BLUE SHIELD | Admitting: Endocrinology

## 2015-07-27 ENCOUNTER — Encounter: Payer: Self-pay | Admitting: Endocrinology

## 2015-07-27 ENCOUNTER — Ambulatory Visit (INDEPENDENT_AMBULATORY_CARE_PROVIDER_SITE_OTHER): Payer: BLUE CROSS/BLUE SHIELD | Admitting: Endocrinology

## 2015-07-27 VITALS — BP 130/80 | HR 87 | Temp 98.4°F | Ht 72.0 in | Wt 285.0 lb

## 2015-07-27 LAB — POCT GLYCOSYLATED HEMOGLOBIN (HGB A1C): HEMOGLOBIN A1C: 12.3

## 2015-07-27 MED ORDER — INSULIN GLARGINE 100 UNIT/ML SOLOSTAR PEN: 150.0000 [IU] | PEN_INJECTOR | Freq: Every day | SUBCUTANEOUS | Status: DC

## 2015-07-27 MED ORDER — GLUCOSE BLOOD VI STRP
1.0000 | ORAL_STRIP | Freq: Two times a day (BID) | Status: DC
Start: 2015-07-27 — End: 2020-01-03

## 2015-07-27 NOTE — Progress Notes (Signed)
Subjective:    Patient ID: Daniel Massey, male    DOB: December 04, 1990, 24 y.o.   MRN: [id number]  HPI Pt returns for f/u of diabetes mellitus: DM type: Insulin-requiring type 2.   Dx'ed: 2011 Complications: none Therapy: insulin since 2012 DKA: never Severe hypoglycemia: never. Pancreatitis: never Other: he takes multiple daily injections; he works at [employer], 3rd shift Interval history: He seldom has hypoglycemia, and these usually happen at work, at night, when he misses a meal.  It is highest after he misses the novolog (misses 0-1 dose per day). Past Medical History  Diagnosis Date  . Hypertension   . Diabetes mellitus without complication [facility](HCC)     Past Surgical History  Procedure Laterality Date  . Orif ulnar fracture Right 08/2008    Social History   Social History  . Marital Status: Single    Spouse Name: n/a  . Number of Children: 0  . 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 Smoking status: Never Smoker   . Smokeless tobacco: Never Used  . Alcohol Use: 1.2 oz/week    2 Cans of beer per week  . Drug Use: No  . Sexual Activity:    Partners: Female   Other Topics Concern  . Not on file   Social History Narrative   Graduate of [employer] with a degree in Biology.   [place] lives in [place], [place].    Current Outpatient Prescriptions on File Prior to Visit  Medication Sig Dispense Refill  . lisinopril (PRINIVIL,ZESTRIL) 10 MG tablet Take 1 tablet (10 mg total) by mouth daily. 30 tablet 2  . ipratropium (ATROVENT) 0.03 % nasal spray Place 2 sprays into both nostrils 2 (two) times daily. (Patient not taking: Reported on 02/17/2015) 30 mL 0   No current facility-administered medications on file prior to visit.    Allergies  Allergen Reactions  . Penicillins     unknown    Family History  Problem Relation Age of Onset  . Diabetes Mother   . Hypertension Mother   . Diabetes  Father   . Cancer Father 6150    colon  . Hypertension Father     BP 130/80 mmHg  Pulse 87  Temp(Src) 98.4 F (36.9 C) (Oral)  Ht 6' (1.829 m)  Wt 285 lb (129.275 kg)  BMI 38.64 kg/m2  SpO2 98%  Review of Systems He denies hypoglycemia    Objective:   Physical Exam VITAL SIGNS:  See vs page GENERAL: no distress Pulses: dorsalis pedis intact bilat.   MSK: no deformity of the feet CV: no leg edema Skin:  no ulcer on the feet.  normal color and temp on the feet. Neuro: sensation is intact to touch on the feet    A1c=12.3%    Assessment & Plan:  DM: severe exacerbation: he may do better on qd insulin.  Patient is advised the following: Patient Instructions  check your blood sugar twice a day.  vary the time of day when you check, between before the 3 meals, and at bedtime.  also check if you have symptoms of your blood sugar being too high or too low.  please keep a record of the readings and bring it to your next appointment here.  You can write it on any piece of paper.  please call us sooner if your blood sugar goes below 70, or if you have a  lot of readings over 200.   Please change both current insulins to lantus, 150 units daily.   Here are 2 new meters.  i have sent a prescription to your pharmacy, for strips On this type of insulin schedule, you should eat meals on a regular schedule.  If a meal is missed or significantly delayed, your blood sugar could go low.   Please come back for a follow-up appointment in 1 month.

## 2015-07-27 NOTE — Patient Instructions (Addendum)
check your blood sugar twice a day.  vary the time of day when you check, between before the 3 meals, and at bedtime.  also check if you have symptoms of your blood sugar being too high or too low.  please keep a record of the readings and bring it to your next appointment here.  You can write it on any piece of paper.  please call us sooner if your blood sugar goes below 70, or if you have a lot of readings over 200.   Please change both current insulins to lantus, 150 units daily.   Here are 2 new meters.  i have sent a prescription to your pharmacy, for strips On this type of insulin schedule, you should eat meals on a regular schedule.  If a meal is missed or significantly delayed, your blood sugar could go low.   Please come back for a follow-up appointment in 1 month.

## 2015-08-28 ENCOUNTER — Ambulatory Visit (INDEPENDENT_AMBULATORY_CARE_PROVIDER_SITE_OTHER): Payer: 59 | Admitting: Endocrinology

## 2015-08-28 ENCOUNTER — Encounter: Payer: Self-pay | Admitting: Endocrinology

## 2015-08-28 VITALS — BP 140/88 | HR 90 | Temp 99.0°F | Ht 72.0 in | Wt 293.0 lb

## 2015-08-28 DIAGNOSIS — Z794 Long term (current) use of insulin: Secondary | ICD-10-CM | POA: Diagnosis not present

## 2015-08-28 DIAGNOSIS — IMO0001 Reserved for inherently not codable concepts without codable children: Secondary | ICD-10-CM

## 2015-08-28 DIAGNOSIS — E1165 Type 2 diabetes mellitus with hyperglycemia: Secondary | ICD-10-CM

## 2015-08-28 MED ORDER — INSULIN GLARGINE 100 UNIT/ML SOLOSTAR PEN: 130.0000 [IU] | PEN_INJECTOR | Freq: Every day | SUBCUTANEOUS | Status: DC

## 2015-08-28 NOTE — Patient Instructions (Addendum)
check your blood sugar twice a day.  vary the time of day when you check, between before the 3 meals, and at bedtime.  also check if you have symptoms of your blood sugar being too high or too low.  please keep a record of the readings and bring it to your next appointment here.  You can write it on any piece of paper.  please call us sooner if your blood sugar goes below 70, or if you have a lot of readings over 200.   blood tests (at [facility]) are requested for you today.  We'll let you know about the results. Please continue the same insulin (130 units per day). On this type of insulin schedule, you should eat meals on a regular schedule.  If a meal is missed or significantly delayed, your blood sugar could go low.   Please come back for a follow-up appointment in 3-4 months.

## 2015-08-28 NOTE — Progress Notes (Signed)
Subjective:    Patient ID: Daniel Massey, male    DOB: 1991/06/09, 24 y.o.   MRN: [id number]  HPI Pt returns for f/u of diabetes mellitus:  DM type: Insulin-requiring type 2.   Dx'ed: 2011 Complications: none Therapy: insulin since 2012 DKA: never Severe hypoglycemia: never. Pancreatitis: never. Other: he takes QD insulin, after poor results with multiple daily injections; he works at [employer], 3rd shift.  Interval history: Due to hypoglycemia, he reduced the insulin to 130 units daily.  On this, he says cbg's are in the low-100's.  Past Medical History  Diagnosis Date  . Hypertension   . Diabetes mellitus without complication [facility])     Past Surgical History  Procedure Laterality Date  . Orif ulnar fracture Right 08/2008    Social History   Social History  . Marital Status: Single    Spouse Name: n/a  . Number of Children: 0  . 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 Smoking status: Never Smoker   . Smokeless tobacco: Never Used  . Alcohol Use: 1.2 oz/week    2 Cans of beer per week  . Drug Use: No  . Sexual Activity:    Partners: Female   Other Topics Concern  . Not on file   Social History Narrative   Graduate of [employer] with a degree in Biology.   Family lives in [place], [place].    Current Outpatient Prescriptions on File Prior to Visit  Medication Sig Dispense Refill  . glucose blood (ONETOUCH VERIO) test strip 1 each by Other route 2 (two) times daily. And lancets 2/day 100 each 12  . lisinopril (PRINIVIL,ZESTRIL) 10 MG tablet Take 1 tablet (10 mg total) by mouth daily. 30 tablet 2  . ipratropium (ATROVENT) 0.03 % nasal spray Place 2 sprays into both nostrils 2 (two) times daily. (Patient not taking: Reported on 02/17/2015) 30 mL 0   No current facility-administered medications on file prior to visit.    Allergies  Allergen Reactions  . Penicillins     unknown     Family History  Problem Relation Age of Onset  . Diabetes Mother   . Hypertension Mother   . Diabetes Father   . Cancer Father 3    colon  . Hypertension Father     BP 140/88 mmHg  Pulse 90  Temp(Src) 99 F (37.2 C) (Oral)  Ht 6' (1.829 m)  Wt 293 lb (132.904 kg)  BMI 39.73 kg/m2  SpO2 97%  Review of Systems No weight change    Objective:   Physical Exam VITAL SIGNS:  See vs page GENERAL: no distress SKIN:  Insulin injection sites at the anterior abdomen are normal.     Fructosamine=417    Assessment & Plan:  DM: he needs increased rx.  Patient is advised the following: Patient Instructions  check your blood sugar twice a day.  vary the time of day when you check, between before the 3 meals, and at bedtime.  also check if you have symptoms of your blood sugar being too high or too low.  please keep a record of the readings and bring it to your next appointment here.  You can write it on any piece of paper.  please call us sooner if your blood sugar goes below 70, or if you have a lot of readings over 200.   blood tests (at [facility]) are  requested for you today.  We'll let you know about the results. Please continue the same insulin (130 units per day). On this type of insulin schedule, you should eat meals on a regular schedule.  If a meal is missed or significantly delayed, your blood sugar could go low.   Please come back for a follow-up appointment in 3-4 months.    addendum: increase to 140 units.

## 2015-08-30 LAB — FRUCTOSAMINE: FRUCTOSAMINE: 417 umol/L — AB (ref 0–285)

## 2015-09-10 ENCOUNTER — Ambulatory Visit (INDEPENDENT_AMBULATORY_CARE_PROVIDER_SITE_OTHER): Payer: 59

## 2015-09-10 ENCOUNTER — Ambulatory Visit (INDEPENDENT_AMBULATORY_CARE_PROVIDER_SITE_OTHER): Payer: BLUE CROSS/BLUE SHIELD | Admitting: Emergency Medicine

## 2015-09-10 VITALS — BP 118/78 | HR 88 | Temp 98.9°F | Resp 16 | Ht 72.0 in | Wt 292.0 lb

## 2015-09-10 DIAGNOSIS — M79645 Pain in left finger(s): Secondary | ICD-10-CM

## 2015-09-10 NOTE — Progress Notes (Signed)
This chart was scribed for Daniel ChrisSteven Cadince Hilscher, MD by Stann Oresung-Kai Tsai, Medical Scribe. This patient was seen in Room 2 and the patient's care was started 8:14 AM.  Chief Complaint:  Chief Complaint  Patient presents with  . Hand Pain    Left ring finger swollen x 3 days    HPI: Daniel Massey is a 24 y.o. male [facility] complaining of left ring finger swollen noticed 3 days ago. He denies any known injury to the area. He denies wearing a ring. He can bend the finger. He has history of DM with controlled sugar. He remembers accidentally bumping his finger into something, but not noticeable. He noticed the blue discoloration 2 nights ago. He has some pain when the finger is straightened. He's taken aspirin and ibuprofen without relief.   He is right handed.  He works in a lab, as a Quarry managertechnologist, for [employer].   Past Medical History  Diagnosis Date  . Hypertension   . Diabetes mellitus without complication [facility](HCC)    Past Surgical History  Procedure Laterality Date  . Orif ulnar fracture Right 08/2008   Social History   Social History  . Marital Status: Single    Spouse Name: n/a  . Number of Children: 0  . 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 Smoking status: Never Smoker   . Smokeless tobacco: Never Used  . Alcohol Use: 1.2 oz/week    2 Cans of beer per week  . Drug Use: No  . Sexual Activity:    Partners: Female   Other Topics Concern  . None   Social History Narrative   Graduate of [employer] with a degree in Biology.   Family lives in [place], [place].   Family History  Problem Relation Age of Onset  . Diabetes Mother   . Hypertension Mother   . Diabetes Father   . Cancer Father [id number]    colon  . Hypertension Father    Allergies  Allergen Reactions  . Penicillins     unknown   Prior to Admission medications   Medication Sig Start Date End Date Taking? Authorizing  Provider  glucose blood (ONETOUCH VERIO) test strip 1 each by Other route 2 (two) times daily. And lancets 2/day 07/27/15  Yes Romero BellingSean Ellison, MD  Insulin Glargine (LANTUS SOLOSTAR) 100 UNIT/ML Solostar Pen Inject 130 Units into the skin daily. And pen needles 2/day 08/28/15  Yes Romero BellingSean Ellison, MD  lisinopril (PRINIVIL,ZESTRIL) 10 MG tablet Take 1 tablet (10 mg total) by mouth daily. 11/24/14  Yes Chelle Jeffery, PA-C     ROS:  Constitutional: negative for chills, fever, night sweats, weight changes, or fatigue  HEENT: negative for vision changes, hearing loss, congestion, rhinorrhea, ST, epistaxis, or sinus pressure Cardiovascular: negative for chest pain or palpitations Respiratory: negative for hemoptysis, wheezing, shortness of breath, or cough Abdominal: negative for abdominal pain, nausea, vomiting, diarrhea, or constipation Dermatological: negative for rash; positive for edema (left ring finger)  Neurologic: negative for headache, dizziness, or syncope All other systems reviewed and are otherwise negative with the exception to those above and in the HPI.  PHYSICAL EXAM: Filed Vitals:   09/10/15 0812  BP: 118/78  Pulse: 88  Temp: 98.9 F (37.2 C)  Resp: 16   Body mass index is 39.59 kg/(m^2).   General: Alert, no acute distress HEENT:  Normocephalic, atraumatic, oropharynx patent.  Eye: Nonie Hoyer [facility] Cardiovascular:  Regular rate and rhythm, no rubs murmurs or gallops.  No Carotid bruits, radial pulse intact. No pedal edema.  Respiratory: Clear to auscultation bilaterally.  No wheezes, rales, or rhonchi.  No cyanosis, no use of accessory musculature Abdominal: No organomegaly, abdomen is soft and non-tender, positive bowel sounds. No masses. Musculoskeletal: Gait intact. Left ring finger: swelling over proximal and middle phalanx, bluish discoloration at PIP joint at that finger, pain with flexion against resistance Skin: No rashes. Neurologic: Facial musculature  symmetric. Psychiatric: Patient acts appropriately throughout our interaction.  Lymphatic: No cervical or submandibular lymphadenopathy Genitourinary/Anorectal: No acute findings   LABS:    EKG/XRAY:   Primary read interpreted by Dr. Cleta Alberts at [facility]: xray left hand: no acute findings.    ASSESSMENT/PLAN: I did not see a fracture on x-ray. He is buddy taped to his middle finger. He will let me know if he continues to have discomfort. If he develops fever or worsening redness he will let me know.I personally performed the services described in this documentation, which was scribed in my presence. The recorded information has been reviewed and is accurate.  By signing my name below, I, Stann Ore, attest that this documentation has been prepared under the direction and in the presence of Daniel Chris, MD. Electronically Signed: Stann Ore, Scribe. 09/10/2015 , 8:14 AM .    Michaell Cowing sideeffects, risk and benefits, and alternatives of medications d/w patient. Patient is aware that all medications have potential sideeffects and we are unable to predict every sideeffect or drug-drug interaction that may occur.  Daniel Chris MD 09/10/2015 8:14 AM

## 2015-09-11 ENCOUNTER — Telehealth: Payer: Self-pay

## 2015-09-11 ENCOUNTER — Ambulatory Visit (INDEPENDENT_AMBULATORY_CARE_PROVIDER_SITE_OTHER): Payer: 59 | Admitting: Family Medicine

## 2015-09-11 VITALS — BP 136/95 | HR 108 | Temp 98.3°F | Resp 18 | Ht 73.0 in | Wt 291.0 lb

## 2015-09-11 DIAGNOSIS — L03012 Cellulitis of left finger: Secondary | ICD-10-CM | POA: Diagnosis not present

## 2015-09-11 MED ORDER — DOXYCYCLINE HYCLATE 100 MG PO CAPS
100.0000 mg | ORAL_CAPSULE | Freq: Two times a day (BID) | ORAL | Status: DC
Start: 2015-09-11 — End: 2015-09-16

## 2015-09-11 MED ORDER — CEFTRIAXONE SODIUM 1 G IJ SOLR
1.0000 g | Freq: Once | INTRAMUSCULAR | Status: AC
  Administered 2015-09-11: 1 g via INTRAMUSCULAR

## 2015-09-11 NOTE — Telephone Encounter (Signed)
Pt was here 11/27 by Dr. Cleta Albertsaub for Pain of finger of left hand - Primary. He says his finger has become more swollen and painful. He was told the x-rays were fine yesterday, but was also told to CB if his symptoms worsen. Please advise at [phone]

## 2015-09-11 NOTE — Progress Notes (Signed)
 @  This chart was scribed for Elvina Sidle, MD by Andrew Au, ED Scribe. This patient was seen in room 11 and the patient's care was started at 5:13 PM.  Patient ID: Daniel Massey MRN: [id number], DOB: Jan 16, 1991, 24 y.o. Date of Encounter: 09/11/2015, 5:13 PM  Primary Physician: No PCP Per Patient  Chief Complaint:  Chief Complaint  Patient presents with  . Follow-up    swollen finger    HPI: 24 y.o. year old male with history below presents for a follow. Pt was seen here yesterday for swollen left ring finger. Pt states symptoms started at work 4 days ago with left ringer finger pain but did not have swelling until the following day. He returns today with worsening swelling that has spread to left index finger and to left hand. He also notes increased redness. Pt does not wear rings. He works Chartered certified accountant     Past Medical History  Diagnosis Date  . Hypertension   . Diabetes mellitus without complication (HCC)      Home Meds: Prior to Admission medications   Medication Sig Start Date End Date Taking? Authorizing Provider  glucose blood (ONETOUCH VERIO) test strip 1 each by Other route 2 (two) times daily. And lancets 2/day 07/27/15  Yes Romero Belling, MD  Insulin Glargine (LANTUS SOLOSTAR) 100 UNIT/ML Solostar Pen Inject 130 Units into the skin daily. And pen needles 2/day 08/28/15  Yes Romero Belling, MD  lisinopril (PRINIVIL,ZESTRIL) 10 MG tablet Take 1 tablet (10 mg total) by mouth daily. 11/24/14  Yes Porfirio Oar, PA-C    Allergies:  Allergies  Allergen Reactions  . Penicillins     unknown    Social History   Social History  . Marital Status: Single    Spouse Name: n/a  . Number of Children: 0  . 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 Smoking status: Never Smoker   . Smokeless tobacco: Never Used  . Alcohol Use: 1.2 oz/week    2 Cans of beer per week  . Drug Use: No  .  Sexual Activity:    Partners: Female   Other Topics Concern  . Not on file   Social History Narrative   Graduate of [employer] with a degree in Biology.   Family lives in [place], [place].     Review of Systems: Constitutional: negative for chills, fever, night sweats, weight changes, or fatigue  HEENT: negative for vision changes, hearing loss, congestion, rhinorrhea, ST, epistaxis, or sinus pressure Cardiovascular: negative for chest pain or palpitations Respiratory: negative for hemoptysis, wheezing, shortness of breath, or cough Abdominal: negative for abdominal pain, nausea, vomiting, diarrhea, or constipation Dermatological: negative for rash Neurologic: negative for headache, dizziness, or syncope All other systems reviewed and are otherwise negative with the exception to those above and in the HPI.   Physical Exam: Blood pressure 136/95, pulse 108, temperature 98.3 F (36.8 C), temperature source Oral, resp. rate 18, height  (1.854 m), weight 291 lb (131.997 kg), SpO2 98 %., Body mass index is 38.4 kg/(m^2). General: Well developed, well nourished, in no acute distress. Head: Normocephalic, atraumatic, eyes without discharge, sclera non-icteric, nares are without discharge. Bilateral auditory canals clear, TM's are without perforation, Oral cavity moist Neck: Supple. No thyromegaly. Full ROM. No lymphadenopathy. Lungs: Clear bilaterally to auscultation without wheezes, rales, or rhonchi. Breathing is unlabored. Heart: RRR with S1 S2. No murmurs, rubs,  or gallops appreciated. Msk:  Strength and tone normal for age. Extremities/Skin: Warm and dry. No clubbing or cyanosis. No edema. No rashes or suspicious lesions.Left proximal phalanx is quite swollen with erythema and tenderness that has spread to palmer of MC Neuro: Alert and oriented X 3. Moves all extremities spontaneously. Gait is normal. CNII-XII grossly in tact. Psych:  Responds to questions appropriately with a normal  affect.    ASSESSMENT AND PLAN:  24 y.o. year old male with rapidly worsening cellulits of left ring finger    ICD-9-CM ICD-10-CM   1. Cellulitis of finger of left hand 681.00 L03.012 doxycycline (VIBRAMYCIN) 100 MG capsule     cefTRIAXone (ROCEPHIN) injection 1 g   Note scribed by Ms. Raven Small Signed, Elvina SidleKurt Keonna Raether, MD 09/11/2015 5:13 PM

## 2015-09-11 NOTE — Patient Instructions (Signed)
I'm expecting significant improvement over the next 48 hours. If you don't see improvement or if he becomes much more comfortable, please return right away so we can take the next step toward cure

## 2015-09-12 ENCOUNTER — Emergency Department (HOSPITAL_COMMUNITY): Payer: 59 | Admitting: Certified Registered Nurse Anesthetist

## 2015-09-12 ENCOUNTER — Encounter (HOSPITAL_COMMUNITY)
Admission: EM | Disposition: A | Discharge status: Home / Self Care | Payer: Self-pay | Source: Home / Self Care | Attending: Orthopedic Surgery

## 2015-09-12 ENCOUNTER — Telehealth: Payer: Self-pay | Admitting: Emergency Medicine

## 2015-09-12 ENCOUNTER — Emergency Department (HOSPITAL_COMMUNITY): Payer: 59

## 2015-09-12 ENCOUNTER — Inpatient Hospital Stay (HOSPITAL_COMMUNITY)
Admission: EM | Admit: 2015-09-12 | Discharge: 2015-09-16 | DRG: 580 | Disposition: A | Discharge status: Home / Self Care | Payer: 59 | Attending: Orthopedic Surgery | Admitting: Orthopedic Surgery

## 2015-09-12 ENCOUNTER — Encounter (HOSPITAL_COMMUNITY): Payer: Self-pay | Admitting: Emergency Medicine

## 2015-09-12 DIAGNOSIS — I10 Essential (primary) hypertension: Secondary | ICD-10-CM | POA: Diagnosis present

## 2015-09-12 DIAGNOSIS — L02512 Cutaneous abscess of left hand: Secondary | ICD-10-CM

## 2015-09-12 DIAGNOSIS — Z6841 Body Mass Index (BMI) 40.0 and over, adult: Secondary | ICD-10-CM

## 2015-09-12 DIAGNOSIS — R7982 Elevated C-reactive protein (CRP): Secondary | ICD-10-CM

## 2015-09-12 DIAGNOSIS — Z880 Allergy status to penicillin: Secondary | ICD-10-CM | POA: Diagnosis not present

## 2015-09-12 DIAGNOSIS — D72829 Elevated white blood cell count, unspecified: Secondary | ICD-10-CM

## 2015-09-12 DIAGNOSIS — IMO0001 Reserved for inherently not codable concepts without codable children: Secondary | ICD-10-CM

## 2015-09-12 DIAGNOSIS — R739 Hyperglycemia, unspecified: Secondary | ICD-10-CM

## 2015-09-12 DIAGNOSIS — Z8249 Family history of ischemic heart disease and other diseases of the circulatory system: Secondary | ICD-10-CM

## 2015-09-12 DIAGNOSIS — B9561 Methicillin susceptible Staphylococcus aureus infection as the cause of diseases classified elsewhere: Secondary | ICD-10-CM | POA: Diagnosis present

## 2015-09-12 DIAGNOSIS — R700 Elevated erythrocyte sedimentation rate: Secondary | ICD-10-CM

## 2015-09-12 DIAGNOSIS — M7989 Other specified soft tissue disorders: Secondary | ICD-10-CM

## 2015-09-12 DIAGNOSIS — L03012 Cellulitis of left finger: Secondary | ICD-10-CM

## 2015-09-12 DIAGNOSIS — Z833 Family history of diabetes mellitus: Secondary | ICD-10-CM

## 2015-09-12 DIAGNOSIS — Z794 Long term (current) use of insulin: Secondary | ICD-10-CM | POA: Diagnosis not present

## 2015-09-12 DIAGNOSIS — M79642 Pain in left hand: Secondary | ICD-10-CM

## 2015-09-12 DIAGNOSIS — E1165 Type 2 diabetes mellitus with hyperglycemia: Secondary | ICD-10-CM | POA: Diagnosis present

## 2015-09-12 DIAGNOSIS — E66813 Obesity, class 3: Secondary | ICD-10-CM

## 2015-09-12 HISTORY — PX: I & D EXTREMITY: SHX5045

## 2015-09-12 HISTORY — DX: Cutaneous abscess of left hand: L02.512

## 2015-09-12 LAB — CBC WITH DIFFERENTIAL/PLATELET
BASOS ABS: 0 10*3/uL (ref 0.0–0.1)
BASOS PCT: 0 %
EOS ABS: 0.1 10*3/uL (ref 0.0–0.7)
Eosinophils Relative: 0 %
HCT: 46 % (ref 39.0–52.0)
HEMOGLOBIN: 15.8 g/dL (ref 13.0–17.0)
Lymphocytes Relative: 14 %
Lymphs Abs: 2.4 10*3/uL (ref 0.7–4.0)
MCH: 29.3 pg (ref 26.0–34.0)
MCHC: 34.3 g/dL (ref 30.0–36.0)
MCV: 85.2 fL (ref 78.0–100.0)
Monocytes Absolute: 1.2 10*3/uL — ABNORMAL HIGH (ref 0.1–1.0)
Monocytes Relative: 7 %
NEUTROS PCT: 79 %
Neutro Abs: 12.8 10*3/uL — ABNORMAL HIGH (ref 1.7–7.7)
Platelets: 198 10*3/uL (ref 150–400)
RBC: 5.4 MIL/uL (ref 4.22–5.81)
RDW: 12.6 % (ref 11.5–15.5)
WBC: 16.4 10*3/uL — AB (ref 4.0–10.5)

## 2015-09-12 LAB — COMPREHENSIVE METABOLIC PANEL
ALK PHOS: 105 U/L (ref 38–126)
ALT: 23 U/L (ref 17–63)
ANION GAP: 9 (ref 5–15)
AST: 19 U/L (ref 15–41)
Albumin: 4.1 g/dL (ref 3.5–5.0)
BUN: 8 mg/dL (ref 6–20)
CALCIUM: 10.2 mg/dL (ref 8.9–10.3)
CO2: 28 mmol/L (ref 22–32)
CREATININE: 0.83 mg/dL (ref 0.61–1.24)
Chloride: 99 mmol/L — ABNORMAL LOW (ref 101–111)
Glucose, Bld: 279 mg/dL — ABNORMAL HIGH (ref 65–99)
Potassium: 4 mmol/L (ref 3.5–5.1)
Sodium: 136 mmol/L (ref 135–145)
Total Bilirubin: 0.5 mg/dL (ref 0.3–1.2)
Total Protein: 8.2 g/dL — ABNORMAL HIGH (ref 6.5–8.1)

## 2015-09-12 LAB — CBG MONITORING, ED
GLUCOSE-CAPILLARY: 150 mg/dL — AB (ref 65–99)
Glucose-Capillary: 88 mg/dL (ref 65–99)

## 2015-09-12 LAB — SEDIMENTATION RATE: Sed Rate: 20 mm/hr — ABNORMAL HIGH (ref 0–16)

## 2015-09-12 LAB — C-REACTIVE PROTEIN: CRP: 4.5 mg/dL — AB (ref <1.0)

## 2015-09-12 SURGERY — IRRIGATION AND DEBRIDEMENT EXTREMITY
Anesthesia: General | Site: Hand | Laterality: Left

## 2015-09-12 MED ORDER — BISACODYL 10 MG RE SUPP: 10.0000 mg | Freq: Every day | RECTAL | Status: DC | PRN

## 2015-09-12 MED ORDER — LISINOPRIL 10 MG PO TABS
10.0000 mg | ORAL_TABLET | Freq: Every day | ORAL | Status: DC
  Administered 2015-09-13 – 2015-09-15 (×4): 10 mg via ORAL
  Filled 2015-09-12 (×4): qty 1

## 2015-09-12 MED ORDER — METHOCARBAMOL 1000 MG/10ML IJ SOLN
500.0000 mg | Freq: Four times a day (QID) | INTRAMUSCULAR | Status: DC | PRN
  Filled 2015-09-12: qty 5

## 2015-09-12 MED ORDER — GLUCOSE BLOOD VI STRP: 1.0000 | ORAL_STRIP | Freq: Two times a day (BID) | Status: DC

## 2015-09-12 MED ORDER — INSULIN ASPART 100 UNIT/ML ~~LOC~~ SOLN
0.0000 [IU] | SUBCUTANEOUS | Status: DC
  Administered 2015-09-13 (×2): 3 [IU] via SUBCUTANEOUS
  Administered 2015-09-13: 5 [IU] via SUBCUTANEOUS
  Administered 2015-09-13: 8 [IU] via SUBCUTANEOUS
  Administered 2015-09-13: 11 [IU] via SUBCUTANEOUS
  Administered 2015-09-13 – 2015-09-14 (×3): 5 [IU] via SUBCUTANEOUS
  Administered 2015-09-14: 8 [IU] via SUBCUTANEOUS
  Administered 2015-09-14: 5 [IU] via SUBCUTANEOUS
  Administered 2015-09-15 (×2): 3 [IU] via SUBCUTANEOUS
  Administered 2015-09-15: 8 [IU] via SUBCUTANEOUS
  Administered 2015-09-15 – 2015-09-16 (×2): 5 [IU] via SUBCUTANEOUS

## 2015-09-12 MED ORDER — MIDAZOLAM HCL 5 MG/5ML IJ SOLN
INTRAMUSCULAR | Status: DC | PRN
  Administered 2015-09-12 (×2): 1 mg via INTRAVENOUS

## 2015-09-12 MED ORDER — BUPIVACAINE HCL (PF) 0.25 % IJ SOLN
INTRAMUSCULAR | Status: AC
  Filled 2015-09-12: qty 30

## 2015-09-12 MED ORDER — POLYETHYLENE GLYCOL 3350 17 G PO PACK: 17.0000 g | PACK | Freq: Every day | ORAL | Status: DC | PRN

## 2015-09-12 MED ORDER — PROPOFOL 10 MG/ML IV BOLUS
INTRAVENOUS | Status: DC | PRN
  Administered 2015-09-12: 200 mg via INTRAVENOUS

## 2015-09-12 MED ORDER — SODIUM CHLORIDE 0.9 % IR SOLN
Status: DC | PRN
  Administered 2015-09-12 (×2): 1000 mL

## 2015-09-12 MED ORDER — OXYCODONE HCL 5 MG PO TABS
5.0000 mg | ORAL_TABLET | Freq: Once | ORAL | Status: AC | PRN
  Administered 2015-09-12: 5 mg via ORAL

## 2015-09-12 MED ORDER — DIPHENHYDRAMINE HCL 25 MG PO CAPS: 25.0000 mg | ORAL_CAPSULE | Freq: Four times a day (QID) | ORAL | Status: DC | PRN

## 2015-09-12 MED ORDER — MORPHINE SULFATE (PF) 4 MG/ML IV SOLN
4.0000 mg | Freq: Once | INTRAVENOUS | Status: AC
  Administered 2015-09-12: 4 mg via INTRAVENOUS
  Filled 2015-09-12: qty 1

## 2015-09-12 MED ORDER — FENTANYL CITRATE (PF) 250 MCG/5ML IJ SOLN
INTRAMUSCULAR | Status: AC
  Filled 2015-09-12: qty 5

## 2015-09-12 MED ORDER — LABETALOL HCL 5 MG/ML IV SOLN
5.0000 mg | INTRAVENOUS | Status: AC | PRN
  Administered 2015-09-12 (×3): 5 mg via INTRAVENOUS

## 2015-09-12 MED ORDER — LIDOCAINE HCL (CARDIAC) 20 MG/ML IV SOLN
INTRAVENOUS | Status: DC | PRN
  Administered 2015-09-12: 40 mg via INTRAVENOUS

## 2015-09-12 MED ORDER — SENNA 8.6 MG PO TABS
1.0000 | ORAL_TABLET | Freq: Two times a day (BID) | ORAL | Status: DC
  Administered 2015-09-12 – 2015-09-16 (×8): 8.6 mg via ORAL
  Filled 2015-09-12 (×8): qty 1

## 2015-09-12 MED ORDER — FENTANYL CITRATE (PF) 100 MCG/2ML IJ SOLN
INTRAMUSCULAR | Status: AC
  Filled 2015-09-12: qty 2

## 2015-09-12 MED ORDER — ONDANSETRON HCL 4 MG PO TABS: 4.0000 mg | ORAL_TABLET | Freq: Four times a day (QID) | ORAL | Status: DC | PRN

## 2015-09-12 MED ORDER — LACTATED RINGERS IV SOLN
INTRAVENOUS | Status: DC
  Administered 2015-09-12: 17:00:00 via INTRAVENOUS

## 2015-09-12 MED ORDER — INSULIN GLARGINE 100 UNIT/ML ~~LOC~~ SOLN
140.0000 [IU] | Freq: Every day | SUBCUTANEOUS | Status: DC
  Filled 2015-09-12 (×2): qty 1.4

## 2015-09-12 MED ORDER — MORPHINE SULFATE (PF) 2 MG/ML IV SOLN
2.0000 mg | INTRAVENOUS | Status: DC | PRN
  Administered 2015-09-15: 2 mg via INTRAVENOUS
  Filled 2015-09-12: qty 1

## 2015-09-12 MED ORDER — OXYCODONE HCL 5 MG PO TABS
5.0000 mg | ORAL_TABLET | ORAL | Status: DC | PRN
  Administered 2015-09-12 – 2015-09-16 (×7): 10 mg via ORAL
  Filled 2015-09-12 (×8): qty 2

## 2015-09-12 MED ORDER — ASPIRIN 325 MG PO TABS
325.0000 mg | ORAL_TABLET | Freq: Two times a day (BID) | ORAL | Status: DC
  Administered 2015-09-12 – 2015-09-16 (×8): 325 mg via ORAL
  Filled 2015-09-12 (×8): qty 1

## 2015-09-12 MED ORDER — METHOCARBAMOL 500 MG PO TABS
500.0000 mg | ORAL_TABLET | Freq: Four times a day (QID) | ORAL | Status: DC | PRN
  Administered 2015-09-12 – 2015-09-13 (×2): 500 mg via ORAL
  Filled 2015-09-12 (×2): qty 1

## 2015-09-12 MED ORDER — ONDANSETRON HCL 4 MG/2ML IJ SOLN: 4.0000 mg | Freq: Once | INTRAMUSCULAR | Status: DC | PRN

## 2015-09-12 MED ORDER — LACTATED RINGERS IV SOLN
INTRAVENOUS | Status: DC | PRN
  Administered 2015-09-12: 18:00:00 via INTRAVENOUS

## 2015-09-12 MED ORDER — FAMOTIDINE 20 MG PO TABS: 20.0000 mg | ORAL_TABLET | Freq: Two times a day (BID) | ORAL | Status: DC | PRN

## 2015-09-12 MED ORDER — MAGNESIUM CITRATE PO SOLN: 1.0000 | Freq: Once | ORAL | Status: DC | PRN

## 2015-09-12 MED ORDER — OXYCODONE HCL 5 MG/5ML PO SOLN: 5.0000 mg | Freq: Once | ORAL | Status: AC | PRN

## 2015-09-12 MED ORDER — FENTANYL CITRATE (PF) 100 MCG/2ML IJ SOLN
INTRAMUSCULAR | Status: DC | PRN
  Administered 2015-09-12: 100 ug via INTRAVENOUS
  Administered 2015-09-12: 50 ug via INTRAVENOUS

## 2015-09-12 MED ORDER — LABETALOL HCL 5 MG/ML IV SOLN
INTRAVENOUS | Status: AC
  Administered 2015-09-12: 5 mg via INTRAVENOUS
  Filled 2015-09-12: qty 4

## 2015-09-12 MED ORDER — FENTANYL CITRATE (PF) 100 MCG/2ML IJ SOLN
INTRAMUSCULAR | Status: AC
  Administered 2015-09-12: 50 ug via INTRAVENOUS
  Filled 2015-09-12: qty 2

## 2015-09-12 MED ORDER — PIPERACILLIN-TAZOBACTAM 3.375 G IVPB
3.3750 g | Freq: Three times a day (TID) | INTRAVENOUS | Status: DC
  Administered 2015-09-13 – 2015-09-15 (×9): 3.375 g via INTRAVENOUS
  Filled 2015-09-12 (×11): qty 50

## 2015-09-12 MED ORDER — GADOBENATE DIMEGLUMINE 529 MG/ML IV SOLN
20.0000 mL | Freq: Once | INTRAVENOUS | Status: AC | PRN
  Administered 2015-09-12: 20 mL via INTRAVENOUS

## 2015-09-12 MED ORDER — MIDAZOLAM HCL 2 MG/2ML IJ SOLN
INTRAMUSCULAR | Status: AC
  Filled 2015-09-12: qty 2

## 2015-09-12 MED ORDER — FENTANYL CITRATE (PF) 100 MCG/2ML IJ SOLN
25.0000 ug | INTRAMUSCULAR | Status: DC | PRN
  Administered 2015-09-12 (×3): 50 ug via INTRAVENOUS

## 2015-09-12 MED ORDER — MORPHINE SULFATE (PF) 2 MG/ML IV SOLN
2.0000 mg | INTRAVENOUS | Status: DC | PRN
  Administered 2015-09-12 (×3): 2 mg via INTRAVENOUS
  Filled 2015-09-12 (×3): qty 1

## 2015-09-12 MED ORDER — PROMETHAZINE HCL 25 MG RE SUPP: 12.5000 mg | Freq: Four times a day (QID) | RECTAL | Status: DC | PRN

## 2015-09-12 MED ORDER — ONDANSETRON HCL 4 MG/2ML IJ SOLN
INTRAMUSCULAR | Status: DC | PRN
  Administered 2015-09-12: 4 mg via INTRAVENOUS

## 2015-09-12 MED ORDER — SUCCINYLCHOLINE CHLORIDE 20 MG/ML IJ SOLN
INTRAMUSCULAR | Status: DC | PRN
  Administered 2015-09-12: 160 mg via INTRAVENOUS

## 2015-09-12 MED ORDER — DOCUSATE SODIUM 100 MG PO CAPS
100.0000 mg | ORAL_CAPSULE | Freq: Two times a day (BID) | ORAL | Status: DC
  Administered 2015-09-12 – 2015-09-16 (×8): 100 mg via ORAL
  Filled 2015-09-12 (×8): qty 1

## 2015-09-12 MED ORDER — POTASSIUM CHLORIDE IN NACL 20-0.45 MEQ/L-% IV SOLN
INTRAVENOUS | Status: DC
Start: 2015-09-12 — End: 2015-09-16
  Administered 2015-09-13 – 2015-09-15 (×2): via INTRAVENOUS
  Filled 2015-09-12 (×8): qty 1000

## 2015-09-12 MED ORDER — ONDANSETRON HCL 4 MG/2ML IJ SOLN
4.0000 mg | Freq: Four times a day (QID) | INTRAMUSCULAR | Status: DC | PRN
  Administered 2015-09-14: 4 mg via INTRAVENOUS
  Filled 2015-09-12: qty 2

## 2015-09-12 MED ORDER — OXYCODONE HCL 5 MG PO TABS
ORAL_TABLET | ORAL | Status: AC
  Filled 2015-09-12: qty 1

## 2015-09-12 MED ORDER — VANCOMYCIN HCL IN DEXTROSE 1-5 GM/200ML-% IV SOLN
INTRAVENOUS | Status: AC
  Administered 2015-09-12: 1000 mg via INTRAVENOUS
  Filled 2015-09-12: qty 200

## 2015-09-12 MED ORDER — VITAMIN C 500 MG PO TABS
1000.0000 mg | ORAL_TABLET | Freq: Every day | ORAL | Status: DC
  Administered 2015-09-13 – 2015-09-16 (×5): 1000 mg via ORAL
  Filled 2015-09-12 (×5): qty 2

## 2015-09-12 MED ORDER — PROPOFOL 10 MG/ML IV BOLUS
INTRAVENOUS | Status: AC
  Filled 2015-09-12: qty 20

## 2015-09-12 MED ORDER — VANCOMYCIN HCL 10 G IV SOLR
1250.0000 mg | Freq: Three times a day (TID) | INTRAVENOUS | Status: DC
  Administered 2015-09-13 – 2015-09-15 (×7): 1250 mg via INTRAVENOUS
  Filled 2015-09-12 (×10): qty 1250

## 2015-09-12 SURGICAL SUPPLY — 38 items
BANDAGE ELASTIC 4 VELCRO ST LF (GAUZE/BANDAGES/DRESSINGS) ×4 IMPLANT
BNDG CONFORM 2 STRL LF (GAUZE/BANDAGES/DRESSINGS) IMPLANT
BNDG GAUZE ELAST 4 BULKY (GAUZE/BANDAGES/DRESSINGS) ×6 IMPLANT
CORDS BIPOLAR (ELECTRODE) ×2 IMPLANT
CUFF TOURNIQUET SINGLE 18IN (TOURNIQUET CUFF) ×2 IMPLANT
CUFF TOURNIQUET SINGLE 24IN (TOURNIQUET CUFF) IMPLANT
DRSG ADAPTIC 3X8 NADH LF (GAUZE/BANDAGES/DRESSINGS) ×2 IMPLANT
DRSG KUZMA FLUFF (GAUZE/BANDAGES/DRESSINGS) ×4 IMPLANT
GAUZE SPONGE 4X4 12PLY STRL (GAUZE/BANDAGES/DRESSINGS) ×2 IMPLANT
GAUZE XEROFORM 1X8 LF (GAUZE/BANDAGES/DRESSINGS) ×2 IMPLANT
GLOVE BIOGEL M STRL SZ7.5 (GLOVE) ×2 IMPLANT
GLOVE SS BIOGEL STRL SZ 8 (GLOVE) ×1 IMPLANT
GLOVE SUPERSENSE BIOGEL SZ 8 (GLOVE) ×1
GOWN STRL REUS W/ TWL LRG LVL3 (GOWN DISPOSABLE) ×1 IMPLANT
GOWN STRL REUS W/ TWL XL LVL3 (GOWN DISPOSABLE) ×2 IMPLANT
GOWN STRL REUS W/TWL LRG LVL3 (GOWN DISPOSABLE) ×1
GOWN STRL REUS W/TWL XL LVL3 (GOWN DISPOSABLE) ×2
HANDPIECE INTERPULSE COAX TIP (DISPOSABLE)
KIT BASIN OR (CUSTOM PROCEDURE TRAY) ×2 IMPLANT
KIT ROOM TURNOVER OR (KITS) ×2 IMPLANT
MANIFOLD NEPTUNE II (INSTRUMENTS) ×2 IMPLANT
NEEDLE HYPO 25GX1X1/2 BEV (NEEDLE) ×2 IMPLANT
NS IRRIG 1000ML POUR BTL (IV SOLUTION) ×2 IMPLANT
PACK ORTHO EXTREMITY (CUSTOM PROCEDURE TRAY) ×2 IMPLANT
PAD ARMBOARD 7.5X6 YLW CONV (MISCELLANEOUS) ×2 IMPLANT
PAD CAST 4YDX4 CTTN HI CHSV (CAST SUPPLIES) ×1 IMPLANT
PADDING CAST COTTON 4X4 STRL (CAST SUPPLIES) ×1
SET HNDPC FAN SPRY TIP SCT (DISPOSABLE) IMPLANT
SPLINT FIBERGLASS 3X12 (CAST SUPPLIES) ×2 IMPLANT
SPONGE GAUZE 4X4 12PLY STER LF (GAUZE/BANDAGES/DRESSINGS) ×2 IMPLANT
SPONGE LAP 4X18 X RAY DECT (DISPOSABLE) ×2 IMPLANT
SYR CONTROL 10ML LL (SYRINGE) ×2 IMPLANT
TOWEL OR 17X24 6PK STRL BLUE (TOWEL DISPOSABLE) ×2 IMPLANT
TOWEL OR 17X26 10 PK STRL BLUE (TOWEL DISPOSABLE) ×2 IMPLANT
TUBE ANAEROBIC SPECIMEN COL (MISCELLANEOUS) IMPLANT
TUBE CONNECTING 12X1/4 (SUCTIONS) ×2 IMPLANT
WATER STERILE IRR 1000ML POUR (IV SOLUTION) ×2 IMPLANT
YANKAUER SUCT BULB TIP NO VENT (SUCTIONS) ×2 IMPLANT

## 2015-09-12 NOTE — Transfer of Care (Signed)
Immediate Anesthesia Transfer of Care Note  Patient: Daniel Massey  Procedure(s) Performed: Procedure(s): IRRIGATION AND DEBRIDEMENT EXTREMITY (N/A)  Patient Location: PACU  Anesthesia Type:General  Level of Consciousness: sedated and patient cooperative  Airway & Oxygen Therapy: Patient Spontanous Breathing and Patient connected to nasal cannula oxygen  Post-op Assessment: Report given to RN, Post -op Vital signs reviewed and stable and Patient moving all extremities  Post vital signs: Reviewed and stable  Last Vitals:  Filed Vitals:   09/12/15 1215 09/12/15 1451  BP: 153/112 162/91  Pulse: 99 102  Temp: 36.9 C   Resp: 16 20    Complications: No apparent anesthesia complications

## 2015-09-12 NOTE — ED Provider Notes (Signed)
CSN: [id number]     Arrival date & time 09/12/15  0701 History   First MD Initiated Contact with Patient 09/12/15 0725     Chief Complaint  Patient presents with  . Hand Pain     (Consider location/radiation/quality/duration/timing/severity/associated sxs/prior Treatment) HPI Comments: Daniel Massey is a 24 y.o. male with a PMHx of HTN and DM2, who presents to the ED with complaints of gradually worsening left ring finger pain and swelling 5 days. Patient states that he developed gradual onset 8/10 constant dull pain in the left ring finger which radiates into the palm, worse with movement, and unrelieved with NSAIDs. 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 Associated symptoms include erythema, worsening swelling, warmth, chills, and tingling in the left ring finger. He was seen at urgent care on 11/27 and at that time it appeared more like a bruise but had a negative x-ray, he then returned to urgent care on 11/28 when the swelling and redness got worse and he was diagnosed with cellulitis and was started on doxycycline, which she has been taking as directed.   He denies any red streaking, drainage, wounds, fevers, chest pain, shortness of breath, abdominal pain, nausea, vomiting, diarrhea, constipation, dysuria, hematuria, numbness, weakness, or loss of range of motion although he states movement of his finger is painful. Of note, he has not taken his blood pressure medications today. Pt last ate a bowl of cereal at 4:30am.  Patient is a 24 y.o. male presenting with hand pain. The history is provided by the patient and medical records. No language interpreter was used.  Hand Pain This is a new problem. The current episode started in the past 7 days. The problem occurs constantly. The problem has been gradually worsening.  Associated symptoms include arthralgias (L 4th digit), chills and joint swelling. Pertinent negatives include no abdominal pain, chest pain, fever, myalgias, nausea, numbness, urinary symptoms, vomiting or weakness. Exacerbated by: movement of finger. He has tried NSAIDs for the symptoms. The treatment provided no relief.    Past Medical History  Diagnosis Date  . Hypertension   . Diabetes mellitus without complication [facility])    Past Surgical History  Procedure Laterality Date  . Orif ulnar fracture Right 08/2008   Family History  Problem Relation Age of Onset  . Diabetes Mother   . Hypertension Mother   . Diabetes Father   . Cancer Father 27    colon  . Hypertension Father    Social History  Substance Use Topics  . Smoking status: Never Smoker   . Smokeless tobacco: Never Used  . Alcohol Use: 1.2 oz/week    2 Cans of beer per week    Review of Systems  Constitutional: Positive for chills. Negative for fever.  Respiratory: Negative for shortness of breath.   Cardiovascular: Negative for chest pain.  Gastrointestinal: Negative for nausea, vomiting, abdominal pain, diarrhea and constipation.  Genitourinary: Negative for dysuria and hematuria.  Musculoskeletal: Positive for joint swelling and arthralgias (L 4th digit). Negative for myalgias.  Skin: Positive for color change (warmth and erythema of L 4th digit). Negative for wound.  Allergic/Immunologic: Positive for immunocompromised state (diabetic).  Neurological: Negative for weakness and numbness.       +tingling in L 4th digit  Psychiatric/Behavioral: Negative for confusion.   10 Systems reviewed and are  negative for acute change except as noted in the HPI.    Allergies  Penicillins  Home Medications   Prior to Admission medications   Medication Sig Start Date End Date Taking? Authorizing Provider  doxycycline (VIBRAMYCIN) 100 MG capsule Take 1 capsule (100 mg total) by mouth 2 (two) times daily. 09/11/15   Robyn Haber, MD  glucose blood (ONETOUCH VERIO) test strip 1 each by Other route 2 (two) times daily. And lancets 2/day 07/27/15   Renato Shin, MD  Insulin Glargine (LANTUS SOLOSTAR) 100 UNIT/ML Solostar Pen Inject 130 Units into the skin daily. And pen needles 2/day 08/28/15   Renato Shin, MD  lisinopril (PRINIVIL,ZESTRIL) 10 MG tablet Take 1 tablet (10 mg total) by mouth daily. 11/24/14   Chelle Jeffery, PA-C   BP 154/119 mmHg  Pulse 95  Temp(Src) 99.3 F (37.4 C) (Oral)  Resp 20  Ht _0  (1.854 m)  Wt 131.543 kg  BMI 38.27 kg/m2  SpO2 99%   Physical Exam  Constitutional: He is oriented to person, place, and time. Vital signs are normal. He appears well-developed and well-nourished.  Non-toxic appearance. No distress.  Afebrile, nontoxic, NAD  HENT:  Head: Normocephalic and atraumatic.  Mouth/Throat: Oropharynx is clear and moist and mucous membranes are normal.  Eyes: Conjunctivae and EOM are normal. Right eye exhibits no discharge. Left eye exhibits no discharge.  Neck: Normal range of motion. Neck supple.  Cardiovascular: Normal rate, regular rhythm, normal heart sounds and intact distal pulses.  Exam reveals no gallop and no friction rub.   No murmur heard. Pulmonary/Chest: Effort normal and breath sounds normal. No respiratory distress. He has no decreased breath sounds. He has no wheezes. He has no rhonchi. He has no rales.  Abdominal: Soft. Normal appearance and bowel sounds are normal. He exhibits no distension. There is no tenderness. There is no rigidity, no rebound and no guarding.  Musculoskeletal:       Left hand: He exhibits decreased range of motion (due to pain), tenderness and swelling. He exhibits no bony tenderness, normal two-point discrimination, normal capillary refill, no deformity and no laceration. Normal sensation noted. Normal strength noted.       Hands: L 4th digit with erythema, warmth, swelling, and TTP over proximal phalanx, with erythema extending into  palm, with limited ROM at MCP due to pain, retained PIP/DIP ROM, but painful passive extension of digit. Digit held in slight flexion at MCP joint. Swollen area of digit very tense, tenderness extending along flexor tendon sheath. Cap refill brisk and present, strength and sensation grossly intact, no obvious skin injury noted.   SEE PICTURE BELOW  Neurological: He is alert and oriented to person, place, and time. He has normal strength. No sensory deficit.  Skin: Skin is warm, dry and intact. No rash noted. There is erythema.  L hand/finger erythema as noted above and pictured below. SEE PICTURE BELOW  Psychiatric: He has a normal mood and affect.  Nursing note and vitals reviewed.     ED Course  Procedures (including critical care time) Labs Review Labs Reviewed  SEDIMENTATION RATE - Abnormal; Notable for the following:    Sed Rate 20 (*)    All other components within normal limits  C-REACTIVE PROTEIN - Abnormal; Notable for the following:    CRP 4.5 (*)    All other components within normal limits  COMPREHENSIVE METABOLIC PANEL - Abnormal; Notable for the following:    Chloride 99 (*)    Glucose, Bld 279 (*)  Total Protein 8.2 (*)    All other components within normal limits  CBC WITH DIFFERENTIAL/PLATELET - Abnormal; Notable for the following:    WBC 16.4 (*)    Neutro Abs 12.8 (*)    Monocytes Absolute 1.2 (*)    All other components within normal limits    Imaging Review Dg Finger Ring Left  09/12/2015  CLINICAL DATA:  Finger swelling for 1 week possible infection EXAM: LEFT RING FINGER 2+V COMPARISON:  09/10/2015 FINDINGS: Three views of the left fourth finger submitted. There is diffuse soft tissue swelling with progression from prior exam. No periosteal reaction or bony erosion. No radiopaque foreign body. No acute fracture or subluxation. IMPRESSION: Diffuse soft tissue swelling with progression from prior exam. No acute fracture or subluxation. No periosteal  reaction or bony erosion. Electronically Signed   By: Lahoma Crocker M.D.   On: 09/12/2015 09:04   Dg Hand Complete Left  09/10/2015  CLINICAL DATA:  24 year old male with pain and swelling in the left ring finger for the past 2-3 days. EXAM: LEFT HAND - COMPLETE 3+ VIEW COMPARISON:  No priors. FINDINGS: Multiple views of the left hand demonstrate no acute displaced fracture, subluxation, dislocation, or soft tissue abnormality. IMPRESSION: No acute radiographic abnormality of the left hand. Electronically Signed   By: Vinnie Langton M.D.   On: 09/10/2015 10:39   Dg Finger Ring Left  09/10/2015  CLINICAL DATA:  24 year old male with history of pain and swelling in the left ring finger for the past 2-3 days. EXAM: LEFT RING FINGER 2+V COMPARISON:  No priors. FINDINGS: Multiple views of the left fourth finger demonstrate no acute displaced fracture, subluxation, dislocation, or soft tissue abnormality. IMPRESSION: No acute radiographic abnormality of the left fourth finger. Electronically Signed   By: Vinnie Langton M.D.   On: 09/10/2015 10:40  I have personally reviewed and evaluated these images and lab results as part of my medical decision-making.   EKG Interpretation None      MDM   Final diagnoses:  Pain of left hand  Finger swelling  Essential hypertension  Hyperglycemia  Leukocytosis  Abscess of finger of left hand  Elevated C-reactive protein (CRP)  ESR raised    24 y.o. male here with left hand pain, swelling, erythema, warmth, and tingling in the left ring finger. No known injury or trauma, had x-rays at urgent care which were negative, was started on doxycycline yesterday for presumed cellulitis, but symptoms worsened. Patient with +Kanaval signs concerning for flexor tenosynovitis. NVI with ROM preserved although painful passive ROM of finger. Will consult hand surgery for likely OR I&D. Of note, pt hasn't taken his home BP meds today, currently asymptomatic, doubt need for  further work up for HTN at this time. Will reassess shortly.   8:08 AM Dr. Amedeo Plenty returning page, will have his PA come see pt. Would like CMP, CBC, CRP, ESR, and new xray done today. Would like pt to remain NPO as this will likely need formal I&D in OR. Will monitor. Will give pain meds and reassess shortly  11:04 AM Doristine Devoid for Dr. Amedeo Plenty here to see pt, feels this is an abscess and cellulitis not yet flexor tenosynovitis but concerning enough that he will need an MRI here and then transfer to [facility] for OR I&D, Aaron Edelman has placed the order for MRI and once this is done he will need to be sent over, has the OR booked at 5pm for his case. Asked that I call him  when MRI is done to arrange transfer. He stated they'd likely admit him after surgery. Will monitor pt and reassess after MRI. Of note, his labs show CRP 4.5, ESR 20, CMP with glucose 279 without anion gap, CBC w/diff showing leukocytosis at 06.0 with neutrophilic predominance. Xray revealed progression of soft tissue swelling, but no bony erosion or periosteal reaction.   12:30 PM Pt back from MRI. Called Avelina Laine PA-C who would like pt transferred to short stay if possible, called to short stay and per charge nurse there pt is accepted to a bed there until his surgery at 5pm. Pt stable at this time, MRI still pending. PRN pain med orders in, Avelina Laine PA-C states he will be admitted after his surgery tonight, states he'll write the orders as soon as he's out of the surgical case he's currently in. Please see his notes for further documentation of care.  BP 153/112 mmHg  Pulse 104  Temp(Src) 98.4 F (36.9 C) (Oral)  Resp 16  Ht _0  (1.854 m)  Wt 131.543 kg  BMI 38.27 kg/m2  SpO2 99%  Meds ordered this encounter  Medications  . morphine 4 MG/ML injection 4 mg    Sig:   . morphine 2 MG/ML injection 2 mg    Sig:   . gadobenate dimeglumine (MULTIHANCE) injection 20 mL    Sig:      Cedricka Sackrider Camprubi-Soms, PA-C 09/12/15  1236  Daniel Reichert, MD 09/13/15 0730

## 2015-09-12 NOTE — Progress Notes (Signed)
ANTIBIOTIC CONSULT NOTE - INITIAL  Pharmacy Consult for Zosyn and Vancomycin Indication: Left hand/ring finger abscess with ascending cellulitis  Allergies  Allergen Reactions  . Penicillins     Just didn't work Has patient had a PCN reaction causing immediate rash, facial/tongue/throat swelling, SOB or lightheadedness with hypotension: unknown Has patient had a PCN reaction causing severe rash involving mucus membranes or skin necrosis: unknown Has patient had a PCN reaction that required hospitalization: Unknown Has patient had a PCN reaction occurring within the last 10 years: Unknown If all of the above answers are "NO", then may proceed with Cephalosporin use.     Patient Measurements: Height: 6\' 1"  (185.4 cm) Weight: 290 lb (131.543 kg) IBW/kg (Calculated) : 79.9  Vital Signs: Temp: 98.6 F (37 C) (11/29 2043) Temp Source: Oral (11/29 2043) BP: 145/89 mmHg (11/29 2043) Pulse Rate: 81 (11/29 2043)    Labs:  Recent Labs  09/12/15 0844  WBC 16.4*  HGB 15.8  PLT 198  CREATININE 0.83    Microbiology: Cx data: 11/29 abscess taken in OR: px  Anti-infective's  Vancomycin 11/29 >> Zosyn 11/29 >>   Assessment: 24 yoM admitted with Left hand/ring finger abscess with ascending cellulitis s/p I&D on 11/29.  Goal of Therapy:  Vancomycin trough level 15-20 mcg/ml  Plan:  1. EIZ 3.375 gmQ8H 2. Vancomycin 1 gm received at 1800; will start 1250 mg Q8H at 23:00 tonight 3. F/u cx data, renal fxn and other pertinent labs daily  Pollyann SamplesAndy Kesha Hurrell, PharmD, BCPS 09/12/2015, 9:03 PM Pager: [phone] \

## 2015-09-12 NOTE — Progress Notes (Signed)
Patient ID: Daniel Massey, male   DOB: 1991-03-11, 24 y.o.   MRN: [id number] Ready for surgical I&D We are planning surgery for your upper extremity. The risk and benefits of surgery to include risk of bleeding, infection, anesthesia,  damage to normal structures and failure of the surgery to accomplish its intended goals of relieving symptoms and restoring function have been discussed in detail. With this in mind we plan to proceed. I have specifically discussed with the patient the pre-and postoperative regime and the dos and don'ts and risk and benefits in great detail. Risk and benefits of surgery also include risk of dystrophy(CRPS), chronic nerve pain, failure of the healing process to go onto completion and other inherent risks of surgery The relavent the pathophysiology of the disease/injury process, as well as the alternatives for treatment and postoperative course of action has been discussed in great detail with the patient who desires to proceed.  We will do everything in our power to help you (the patient) restore function to the upper extremity. It is a pleasure to see this patient today. Arlis Everly MD

## 2015-09-12 NOTE — ED Notes (Signed)
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 Patient noticed finger swelling. He went to urgent care yesterday but is returning today because he states it feels more swollen and it hurts real bad.

## 2015-09-12 NOTE — ED Notes (Signed)
Surgeon at bedside.  

## 2015-09-12 NOTE — Telephone Encounter (Signed)
Patient returned to clinic and was seen by Dr. Milus GlazierLauenstein last night. He subsequently is in the emergency room 11/29 with consideration for surgery of that finger

## 2015-09-12 NOTE — Op Note (Signed)
See dictation#[id number] Amanda PeaGramig MD

## 2015-09-12 NOTE — Anesthesia Postprocedure Evaluation (Signed)
Anesthesia Post Note  Patient: Daniel Massey  Procedure(s) Performed: Procedure(s) (LRB): IRRIGATION AND DEBRIDEMENT LEFT RING FINGER ABSCESS (Left)  Patient location during evaluation: PACU Anesthesia Type: General Level of consciousness: awake, awake and alert and oriented Pain management: pain level controlled Vital Signs Assessment: post-procedure vital signs reviewed and stable Respiratory status: spontaneous breathing Anesthetic complications: no    Last Vitals:  Filed Vitals:   09/12/15 2000 09/12/15 2005  BP: 131/88 131/88  Pulse: 102 100  Temp: 36.6 C   Resp: 24 21    Last Pain:  Filed Vitals:   09/12/15 2007  PainSc: 4                  Elbert Polyakov COKER

## 2015-09-12 NOTE — Telephone Encounter (Signed)
I called the patient told him I had seen the records where he had been treated for cellulitis last night. I saw he was in the emergency room. It appears he is having progressive swelling of his finger into his palm. Concern about need for surgery to open this area. I told him to contact us if he needed anything from our office.

## 2015-09-12 NOTE — ED Notes (Signed)
Pt to MRI

## 2015-09-12 NOTE — ED Notes (Signed)
Pt back from MRI 

## 2015-09-12 NOTE — ED Notes (Signed)
[facility]; [facility] delayed; short-stay notified for reason

## 2015-09-12 NOTE — ED Notes (Signed)
PA at bedside.

## 2015-09-12 NOTE — ED Notes (Signed)
Bed: WA20 Expected date:  Expected time:  Means of arrival:  Comments: 

## 2015-09-12 NOTE — H&P (Signed)
Daniel Massey is an 24 y.o. male.   Chief Complaint: Left hand pain HPI: The patient is a 24 year old male presented to the emergency room setting for evaluation of his left hand and ring finger. The patient has had progressive pain and swelling and erythema about the hand and ring finger beginning around Thanksgiving. He has been seen and evaluated in the [facility] urgent care setting on 2 separate occasions, 11/27 and 11/28. Hand surgery was not consulted at that time . The patient was evaluated for possible infection about the ring finger. He was Started on doxycycline and in addition received Rocephin. Unfortunately, he has continued to have progressive symptomatology and presents to the emergency room today for evaluation. He is noted have significant erythema and swelling about the volar aspect of his left hand to include the base of the ring finger. He has exquisite pain at this region. He denies any history of trauma. He does have a history of insulin-dependent diabetes. Patient is employed at [employer]. He states he has felt somewhat feverish and tired. He denies nausea, vomiting . Past Medical History  Diagnosis Date  . Hypertension   . Diabetes mellitus without complication [facility])     Past Surgical History  Procedure Laterality Date  . Orif ulnar fracture Right 08/2008    Family History  Problem Relation Age of Onset  . Diabetes Mother   . Hypertension Mother   . Diabetes Father   . Cancer Father 76    colon  . Hypertension Father    Social History:  reports that he has never smoked. He has never used smokeless tobacco. He reports that he drinks about 1.2 oz of alcohol per week. He reports that he does not use illicit drugs.  Allergies:  Allergies  Allergen Reactions  . Penicillins     Just didn't work Has patient had a PCN reaction causing immediate rash, facial/tongue/throat swelling, SOB or lightheadedness with hypotension: unknown Has patient had a PCN  reaction causing severe rash involving mucus membranes or skin necrosis: unknown Has patient had a PCN reaction that required hospitalization: Unknown Has patient had a PCN reaction occurring within the last 10 years: Unknown If all of the above answers are "NO", then may proceed with Cephalosporin use.      (Not in a hospital admission)  Results for orders placed or performed during the hospital encounter of 09/12/15 (from the past 48 hour(s))  Sedimentation rate     Status: Abnormal   Collection Time: 09/12/15  8:44 AM  Result Value Ref Range   Sed Rate 20 (H) 0 - 16 mm/hr  Comprehensive metabolic panel     Status: Abnormal   Collection Time: 09/12/15  8:44 AM  Result Value Ref Range   Sodium 136 135 - 145 mmol/L   Potassium 4.0 3.5 - 5.1 mmol/L   Chloride 99 (L) 101 - 111 mmol/L   CO2 28 22 - 32 mmol/L   Glucose, Bld 279 (H) 65 - 99 mg/dL   BUN 8 6 - 20 mg/dL   Creatinine, Ser 0.83 0.61 - 1.24 mg/dL   Calcium 10.2 8.9 - 10.3 mg/dL   Total Protein 8.2 (H) 6.5 - 8.1 g/dL   Albumin 4.1 3.5 - 5.0 g/dL   AST 19 15 - 41 U/L   ALT 23 17 - 63 U/L   Alkaline Phosphatase 105 38 - 126 U/L   Total Bilirubin 0.5 0.3 - 1.2 mg/dL   GFR calc non Af Amer >60 >60  mL/min   GFR calc Af Amer >60 >60 mL/min    Comment: (NOTE) The eGFR has been calculated using the CKD EPI equation. This calculation has not been validated in all clinical situations. eGFR's persistently <60 mL/min signify possible Chronic Kidney Disease.    Anion gap 9 5 - 15  CBC with Differential     Status: Abnormal   Collection Time: 09/12/15  8:44 AM  Result Value Ref Range   WBC 16.4 (H) 4.0 - 10.5 K/uL   RBC 5.40 4.22 - 5.81 MIL/uL   Hemoglobin 15.8 13.0 - 17.0 g/dL   HCT 46.0 39.0 - 52.0 %   MCV 85.2 78.0 - 100.0 fL   MCH 29.3 26.0 - 34.0 pg   MCHC 34.3 30.0 - 36.0 g/dL   RDW 12.6 11.5 - 15.5 %   Platelets 198 150 - 400 K/uL   Neutrophils Relative % 79 %   Neutro Abs 12.8 (H) 1.7 - 7.7 K/uL   Lymphocytes  Relative 14 %   Lymphs Abs 2.4 0.7 - 4.0 K/uL   Monocytes Relative 7 %   Monocytes Absolute 1.2 (H) 0.1 - 1.0 K/uL   Eosinophils Relative 0 %   Eosinophils Absolute 0.1 0.0 - 0.7 K/uL   Basophils Relative 0 %   Basophils Absolute 0.0 0.0 - 0.1 K/uL   Dg Hand Complete Left  09/10/2015  CLINICAL DATA:  24 year old male with pain and swelling in the left ring finger for the past 2-3 days. EXAM: LEFT HAND - COMPLETE 3+ VIEW COMPARISON:  No priors. FINDINGS: Multiple views of the left hand demonstrate no acute displaced fracture, subluxation, dislocation, or soft tissue abnormality. IMPRESSION: No acute radiographic abnormality of the left hand. Electronically Signed   By: Vinnie Langton M.D.   On: 09/10/2015 10:39   Dg Finger Ring Left  09/12/2015  CLINICAL DATA:  Finger swelling for 1 week possible infection EXAM: LEFT RING FINGER 2+V COMPARISON:  09/10/2015 FINDINGS: Three views of the left fourth finger submitted. There is diffuse soft tissue swelling with progression from prior exam. No periosteal reaction or bony erosion. No radiopaque foreign body. No acute fracture or subluxation. IMPRESSION: Diffuse soft tissue swelling with progression from prior exam. No acute fracture or subluxation. No periosteal reaction or bony erosion. Electronically Signed   By: Lahoma Crocker M.D.   On: 09/12/2015 09:04   Dg Finger Ring Left  09/10/2015  CLINICAL DATA:  24 year old male with history of pain and swelling in the left ring finger for the past 2-3 days. EXAM: LEFT RING FINGER 2+V COMPARISON:  No priors. FINDINGS: Multiple views of the left fourth finger demonstrate no acute displaced fracture, subluxation, dislocation, or soft tissue abnormality. IMPRESSION: No acute radiographic abnormality of the left fourth finger. Electronically Signed   By: Vinnie Langton M.D.   On: 09/10/2015 10:40    Review of Systems  Constitutional: Positive for fever and malaise/fatigue.  HENT: Negative.   Eyes: Negative.    Respiratory: Negative.   Cardiovascular: Negative.   Gastrointestinal: Negative.   Genitourinary: Negative.   Musculoskeletal:       See history of present illness  Skin: Negative.   Neurological: Negative.   Endo/Heme/Allergies: Negative.     Blood pressure 161/113, pulse 85, temperature 99.3 F (37.4 C), temperature source Oral, resp. rate 18, height $RemoveBe'6\' 1"'uufpIbVPa$  (1.854 m), weight 131.543 kg (290 lb), SpO2 100 %. Physical Exam  The patient is pleasant, no acute distress, alert and oriented 3  Evaluation left upper  extremity reveals swelling and erythema about the volar aspect of his hand to include the region about the MCP extending distally to the PIP. His localized swelling at this region pain and erythema. Thenar, hyperthenar and mid palmar space are soft gentle passive range of motion about the fingers not overly painful. He does not exhibit frank signs purulent flexor tenosynovitis at this juncture but certainly signs consistent with an worrisome abscess about the finger. He has mild tenderness about the third and fourth webspace and is developing some mild degree of swelling about the dorsal aspect of the hand, sensation refill are intact  The patient is alert and oriented in no acute distress. The patient complains of pain in the affected upper extremity.  The patient is noted to have a normal HEENT exam. Lung fields show equal chest expansion and no shortness of breath. Abdomen exam is nontender without distention. Lower extremity examination does not show any fracture dislocation or blood clot symptoms. Pelvis is stable and the neck and back are stable and nontender. Assessment/Plan   Left hand/ring finger abscess with ascending cellulitis Patient Active Problem List   Diagnosis Date Noted  . DM (diabetes mellitus), type 2, uncontrolled ([place]) 09/04/2014  . Obesity, Class III, BMI 40-49.9 (morbid obesity) ([place]) 09/04/2014  . Essential hypertension 09/04/2014     We have had a  discussion today regards to his upper extremity and have recommended proceeding with an MRI of the left hand to include fingers for evaluation and rule out deep abscess. In addition we discussed with the patient our concerns and need for surgical intervention we will plan on proceeding to the operative suite today for irrigation and debridement. We will have him continue nothing by mouth status until that time.We have discussed with the patient the issues regarding their infection to the extremity.  Often times it will take 3-5 days for cultures to become final. During this time we will typically have the patient on intravenous antibiotics until we can find a parenteral route of antibiotic regime specific for the bacteria or organism isolated. We have discussed with the patient the need for daily irrigation and debridement as well as therapy to the area. We have discussed with the patient the necessity of range of motion to the involved joints as discussed today. We have discussed with the patient the unpredictability of infections at times. We'll continue to work towards good pain control and restoration of function. The patient understands the need for meticulous wound care and the necessity of proper followup.  The possible complications of stiffness (loss of motion), resistant infection, possible deep bone infection, possible chronic pain issues, possible need for multiple surgeries and even amputation.  With this in mind the patient understands our goal is to eradicate the infection to quiesence. We will continue to work towards these goals.  Raisa Ditto L 09/12/2015, 10:35 AM

## 2015-09-12 NOTE — Anesthesia Preprocedure Evaluation (Signed)
Anesthesia Evaluation  Patient identified by MRN, date of birth, ID band Patient awake    Reviewed: Allergy & Precautions, NPO status , Patient's Chart, lab work & pertinent test results  Airway Mallampati: II  TM Distance: >3 FB Neck ROM: Full    Dental  (+) Teeth Intact, Dental Advisory Given   Pulmonary    breath sounds clear to auscultation       Cardiovascular hypertension,  Rhythm:Regular Rate:Normal     Neuro/Psych    GI/Hepatic   Endo/Other  diabetes  Renal/GU      Musculoskeletal   Abdominal (+) + obese,   Peds  Hematology   Anesthesia Other Findings   Reproductive/Obstetrics                             Anesthesia Physical Anesthesia Plan  ASA: III  Anesthesia Plan: General   Post-op Pain Management:    Induction: Intravenous and Cricoid pressure planned  Airway Management Planned: Oral ETT  Additional Equipment:   Intra-op Plan:   Post-operative Plan: Extubation in OR  Informed Consent: I have reviewed the patients History and Physical, chart, labs and discussed the procedure including the risks, benefits and alternatives for the proposed anesthesia with the patient or authorized representative who has indicated his/her understanding and acceptance.   Dental advisory given  Plan Discussed with: CRNA and Anesthesiologist  Anesthesia Plan Comments:         Anesthesia Quick Evaluation

## 2015-09-12 NOTE — Progress Notes (Signed)
[facility] Inquired about bed # and Address of hospital [facility] [address] CM discussed with ED RN visitor request- asked to provide more information when available Entered in F/u  Romero BellingSean Ellison, MD On 12/01/2015 You have a scheduled appt at 2 pm on 11/30/14 with Dr Everardo AllEllison Please go to appt or call to reschedule if needed [address] [place] [address] [phone]

## 2015-09-12 NOTE — Anesthesia Procedure Notes (Signed)
Procedure Name: Intubation Date/Time: 09/12/2015 5:45 PM Performed by: Virgel GessHOLTZMAN, Braeley Buskey LEFFEW Pre-anesthesia Checklist: Patient identified, Patient being monitored, Timeout performed, Emergency Drugs available and Suction available Patient Re-evaluated:Patient Re-evaluated prior to inductionOxygen Delivery Method: Circle System Utilized Preoxygenation: Pre-oxygenation with 100% oxygen Intubation Type: IV induction Laryngoscope Size: Mac and 3 Grade View: Grade I Tube type: Oral Tube size: 7.5 mm Number of attempts: 1 Airway Equipment and Method: Stylet Placement Confirmation: ETT inserted through vocal cords under direct vision,  positive ETCO2 and breath sounds checked- equal and bilateral Secured at: 22 cm Tube secured with: Tape Dental Injury: Teeth and Oropharynx as per pre-operative assessment

## 2015-09-13 ENCOUNTER — Encounter (HOSPITAL_COMMUNITY): Payer: Self-pay | Admitting: Orthopedic Surgery

## 2015-09-13 LAB — BASIC METABOLIC PANEL
Anion gap: 9 (ref 5–15)
BUN: 6 mg/dL (ref 6–20)
CALCIUM: 9.1 mg/dL (ref 8.9–10.3)
CO2: 28 mmol/L (ref 22–32)
CREATININE: 0.88 mg/dL (ref 0.61–1.24)
Chloride: 98 mmol/L — ABNORMAL LOW (ref 101–111)
GFR calc non Af Amer: >60 mL/min (ref >60)
Glucose, Bld: 250 mg/dL — ABNORMAL HIGH (ref 65–99)
Potassium: 3.4 mmol/L — ABNORMAL LOW (ref 3.5–5.1)
SODIUM: 135 mmol/L (ref 135–145)

## 2015-09-13 LAB — GLUCOSE, CAPILLARY
GLUCOSE-CAPILLARY: 189 mg/dL — AB (ref 65–99)
GLUCOSE-CAPILLARY: 271 mg/dL — AB (ref 65–99)
Glucose-Capillary: 236 mg/dL — ABNORMAL HIGH (ref 65–99)
Glucose-Capillary: 237 mg/dL — ABNORMAL HIGH (ref 65–99)
Glucose-Capillary: 227 mg/dL — ABNORMAL HIGH (ref 65–99)
Glucose-Capillary: 304 mg/dL — ABNORMAL HIGH (ref 65–99)
Glucose-Capillary: 191 mg/dL — ABNORMAL HIGH (ref 65–99)

## 2015-09-13 MED ORDER — INSULIN GLARGINE 100 UNIT/ML ~~LOC~~ SOLN
70.0000 [IU] | Freq: Every day | SUBCUTANEOUS | Status: DC
  Administered 2015-09-13 – 2015-09-16 (×4): 70 [IU] via SUBCUTANEOUS
  Filled 2015-09-13 (×5): qty 0.7

## 2015-09-13 MED ORDER — LIVING WELL WITH DIABETES BOOK
Freq: Once | Status: AC
  Administered 2015-09-13: 17:00:00
  Filled 2015-09-13: qty 1

## 2015-09-13 NOTE — Op Note (Signed)
NAMEJAHMEER, Daniel Massey NO.:  [id number]  MEDICAL RECORD NO.:  [id number]  LOCATION:  5N22C                        FACILITY:  [name]  PHYSICIAN:  Daniel Massey. Daniel Massey, M.D.DATE OF BIRTH:  1991-04-19  DATE OF PROCEDURE: DATE OF DISCHARGE:                              OPERATIVE REPORT   PREOPERATIVE DIAGNOSIS:  Left ring finger abscess worsening in nature with loss of function in the finger.  POSTOPERATIVE DIAGNOSIS:  Left ring finger abscess worsening in nature with loss of function in the finger.  PROCEDURE: 1. Irrigation and debridement of deep abscess, left ring finger. 2. Limited ulnar digital nerve neurolysis, left ring finger.  SURGEON:  Daniel Massey. Amanda Massey, M.D.  ASSISTANT:  None.  COMPLICATIONS:  None.  ANESTHESIA:  General.  TOURNIQUET TIME:  Less than 30 minutes.  CULTURES:  Aerobic and anaerobic taken.  INDICATIONS FOR THE PROCEDURE:  Mr. Daniel Massey is a 24 year old male with severe infection.  He is a diabetic.  He is had infectious process for days now.  Unfortunately, this has accumulated in the finger which is 3 times its normal size, painful, swollen, and quite problematic. MRI scan would reveal significant abscess and surrounding cellulitic change.  I have counseled him in regard to risks and benefits surgery and he desires to proceed.  OPERATIVE PROCEDURE:  The patient was seen by myself and Anesthesia, taken to the operative arena, time-out was observed.  He was prepped and draped in usual sterile fashion with Hibiclens pre-scrub followed by a Betadine scrub and paint.  Following this, outline marks were made visually and a midline incision was made over the proximal phalanx volarly.  I made a midline incision so as to provide large amount of tissue for coverage, so there would be no neurovascular exposure and hopefully no flexor tendon sheath exposure.  Dissection was carried down immediately encountering the subcutaneous tissue midline  and abscess. This abscess was evacuated and very carefully, we removed devitalized necrotic portions of fat.  Unfortunately, he had a large amount of fatty necrotic change and the tissues were very poor quality.  I performed an ulnar digital nerve neurolysis very gently with facial nerve dissector. I ensured that we did not injure the neurovascular bundles.  I deflated the tourniquet and irrigated.  On deflation of the tourniquet, I did note the patient had rather sluggish refill initially.  I gave him some warm saline on the finger, and he did seem to have adequate capillary refill after this.  Nevertheless, I was impressed with the amount of spasm that are perceived.  There was no iatrogenic injury to the arterial structures and certainly no nerve injury or flexor tendon injury for that matter.  The flexor tendon sheath looked good and given the abscess and fatty necrosis, I felt this was above the flexor apparatus.  We will watch his neurovascular status carefully.  Given his diabetes, his severe infection and other issues, I give this variable prognosis at this juncture.  We have tried everything we can to eradicate his infection.  We will place him on vancomycin as well as Zosyn and ask him to elevate and keep the area clean and dry.  We will monitor him closely.  I  would expect a likely return to the operative theater for possible loose closure or pain in his progress.  For now, given the state of affairs, I advised the finger to breathe, catch up and monitor the neurocirculatory status.  Once again, I would have to give this a variable prognosis given this conditions at the time of surgery and severe amount of necrotic tissue removed from the finger.  This is unfortunately highly impressive for a devastating infection.  We will need to watch him very closely and move forward in a cautious fashion.  I have discussed these issues with the patient's family and all questions have been  encouraged and answered.     Daniel AnoWilliam M. Amanda PeaGramig, M.D.     [facility]  D:  09/12/2015  T:  09/13/2015  Job:  [id number]

## 2015-09-13 NOTE — Progress Notes (Signed)
Called and spoke with PA, Lajean SilviusBryan Buchanon.  Orders received for patient to receive Lantus 70 units daily.  Will follow.  Thanks, Beryl MeagerJenny Cariah Salatino, RN, BC-ADM Inpatient Diabetes Coordinator Pager [phone] (8a-5p)

## 2015-09-13 NOTE — Progress Notes (Signed)
Inpatient Diabetes Program Recommendations  AACE/ADA: New Consensus Statement on Inpatient Glycemic Control (2015)  Target Ranges:  Prepandial:   less than 140 mg/dL      Peak postprandial:   less than 180 mg/dL (1-2 hours)      Critically ill patients:  140 - 180 mg/dL   Review of Glycemic Control Results for Daniel Massey, Daniel Massey (MRN [id number]) as of 09/13/2015 10:38  Ref. Range 09/12/2015 13:24 09/12/2015 16:33 09/13/2015 00:04 09/13/2015 04:26 09/13/2015 08:37  Glucose-Capillary Latest Ref Range: 65-99 mg/dL 88 914150 (H) 782271 (H) 956237 (H) 227 (H)  Results for Daniel Massey, Daniel Massey (MRN [id number]) as of 09/13/2015 10:38  Ref. Range 07/27/2015 13:24  Hemoglobin A1C Unknown 12.3    Diabetes history: Insulin requiring Type 2 diabetes per MD note Outpatient Diabetes medications: Lantus 140 units daily with breakfast Current orders for Inpatient glycemic control: Novolog moderate q 4 hours, Lantus 140 units daily  Inpatient Diabetes Program Recommendations:    Spoke with patient at length.  When I asked him if he had Type 1 vs. Type 2 diabetes, he states Type 2 diabetes "because he does not make much insulin". Explained to patient that Type 2 diabetes is due to insulin resistance and Type 1 diabetes means that the pancreas stops making insulin. He states that his last A1C=8.1%.  Explained that his A1C now is 12.3% and that it means his average blood sugar is 309 mg/dL. Discussed at length the complications from diabetes including infection, kidney failure, cardiovascular, blindness and amputation.   Patient states that he used to be on Levemir/Novolog with meals but was recently changed to Lantus 140 units daily.  He states that he has had hypoglycemia with higher Lantus dose.   Briefly discussed physiological insulin action including basal versus prandial insulin.  Patient is currently working 3rd shift and states that he does not eat regularly and often skips meals (which is when he notices lows).   Discussed hypoglycemia treatment including 15:15 rule (which patient had never known).  He verbalized understanding. Patient admits that he is not monitoring much and A1C indicates poor control of diabetes.   Patient seems motivated to make changes.  He has never had diabetes education since diagnosis.  Will order per protocol/co-sign required.  May consider hospitalist consult due to high A1C? Consider decreasing Lantus to 70 units daily and add Novolog meal coverage 15 units tid with meals (hold if patient eats less than 50%).  Thanks, Beryl MeagerJenny Tequan Redmon, RN, BC-ADM Inpatient Diabetes Coordinator Pager [phone]

## 2015-09-13 NOTE — Progress Notes (Signed)
Patient ID: Daniel Massey, male   DOB: [birth year], 24 y.o.   MRN: [id number] We have discussed with the patient the issues regarding their infection to the extremity. We will continue antibiotics and await culture results. Often times it will take 3-5 days for cultures to become final. During this time we will typically have the patient on intravenous antibiotics until we can find a parenteral route of antibiotic regime specific for the bacteria or organism isolated. We have discussed with the patient the need for daily irrigation and debridement as well as therapy to the area. We have discussed with the patient the necessity of range of motion to the involved joints as discussed today. We have discussed with the patient the unpredictability of infections at times. We'll continue to work towards good pain control and restoration of function. The patient understands the need for meticulous wound care and the necessity of proper followup.  The possible complications of stiffness (loss of motion), resistant infection, possible deep bone infection, possible chronic pain issues, possible need for multiple surgeries and even amputation.  With this in mind the patient understands our goal is to eradicate the infection to quiesence. We will continue to work towards these goals.  Patient has great refill much improved motion all looks well. We'll plan to take down his dressing tomorrow and perform bedside I and D we'll make decisions as to repeat operative debridement as he progresses  I discussed all issues with he and his family Loleta Rosetonight   Diamante Truszkowski MD

## 2015-09-13 NOTE — Progress Notes (Signed)
Utilization review completed.  

## 2015-09-14 LAB — GLUCOSE, CAPILLARY
GLUCOSE-CAPILLARY: 218 mg/dL — AB (ref 65–99)
GLUCOSE-CAPILLARY: 247 mg/dL — AB (ref 65–99)
GLUCOSE-CAPILLARY: 295 mg/dL — AB (ref 65–99)
GLUCOSE-CAPILLARY: 96 mg/dL (ref 65–99)
Glucose-Capillary: 114 mg/dL — ABNORMAL HIGH (ref 65–99)
Glucose-Capillary: 213 mg/dL — ABNORMAL HIGH (ref 65–99)

## 2015-09-14 NOTE — Progress Notes (Signed)
Patient ID: Daniel Massey, male   DOB: Mar 27, 1991, 24 y.o.   MRN: [id number] Patient seen at bedside.  Vital signs stable.  He is growing out staph we will await cultures for sensitivities.  I have performed irrigation and debridement skin subcutaneous tissue excisional nature bedside with curette knife blade and scissor. He tolerated this well he was scoped see irrigated.  We can go ahead and proceed with [facility] and wet-to-dry dressing changes at this point we will initiate hydrotherapy now.  We discussed him all issues.    We have discussed with the patient the issues regarding their infection to the extremity. We will continue antibiotics and await culture results. Often times it will take 3-5 days for cultures to become final. During this time we will typically have the patient on intravenous antibiotics until we can find a parenteral route of antibiotic regime specific for the bacteria or organism isolated. We have discussed with the patient the need for daily irrigation and debridement as well as therapy to the area. We have discussed with the patient the necessity of range of motion to the involved joints as discussed today. We have discussed with the patient the unpredictability of infections at times. We'll continue to work towards good pain control and restoration of function. The patient understands the need for meticulous wound care and the necessity of proper followup.  The possible complications of stiffness (loss of motion), resistant infection, possible deep bone infection, possible chronic pain issues, possible need for multiple surgeries and even amputation.  With this in mind the patient understands our goal is to eradicate the infection to quiesence. We will continue to work towards these goals.  Carleah Yablonski M.D.

## 2015-09-14 NOTE — Progress Notes (Addendum)
Inpatient Diabetes Program Recommendations  AACE/ADA: New Consensus Statement on Inpatient Glycemic Control (2015)  Target Ranges:  Prepandial:   less than 140 mg/dL      Peak postprandial:   less than 180 mg/dL (1-2 hours)      Critically ill patients:  140 - 180 mg/dL   Review of Glycemic Control  Results for Daniel Massey, Daniel Massey (MRN [id number]) as of 09/14/2015 09:29  Ref. Range 09/13/2015 08:37 09/13/2015 11:35 09/13/2015 16:45 09/13/2015 20:09  Glucose-Capillary Latest Ref Range: 65-99 mg/dL 409227 (H) 811304 (H) 914191 (H) 189 (H)    Inpatient Diabetes Program Recommendations: If glucose becomes elevated around lunchtime again today, please consider the following:    Insulin - Meal Coverage: Glucose increased around meal times yesterday. Consider starting Novolog 5 units TID meal coverage in addition to correction scale.  Thanks,  Christena DeemShannon Kiylee Thoreson RN, MSN, [facility] Inpatient Diabetes Coordinator Team Pager [phone] (8a-5p)

## 2015-09-15 ENCOUNTER — Telehealth: Payer: Self-pay | Admitting: Emergency Medicine

## 2015-09-15 LAB — VANCOMYCIN, RANDOM: Vancomycin Rm: 17 ug/mL

## 2015-09-15 LAB — BASIC METABOLIC PANEL
ANION GAP: 13 (ref 5–15)
BUN: 14 mg/dL (ref 6–20)
CALCIUM: 9.5 mg/dL (ref 8.9–10.3)
CO2: 26 mmol/L (ref 22–32)
CREATININE: 2.41 mg/dL — AB (ref 0.61–1.24)
Chloride: 101 mmol/L (ref 101–111)
GFR calc Af Amer: 42 mL/min — ABNORMAL LOW (ref >60)
GFR, EST NON AFRICAN AMERICAN: 36 mL/min — AB (ref >60)
GLUCOSE: 149 mg/dL — AB (ref 65–99)
Potassium: 4 mmol/L (ref 3.5–5.1)
Sodium: 140 mmol/L (ref 135–145)

## 2015-09-15 LAB — CULTURE, ROUTINE-ABSCESS

## 2015-09-15 LAB — GLUCOSE, CAPILLARY
GLUCOSE-CAPILLARY: 239 mg/dL — AB (ref 65–99)
Glucose-Capillary: 158 mg/dL — ABNORMAL HIGH (ref 65–99)
Glucose-Capillary: 180 mg/dL — ABNORMAL HIGH (ref 65–99)
Glucose-Capillary: 297 mg/dL — ABNORMAL HIGH (ref 65–99)
Glucose-Capillary: 77 mg/dL (ref 65–99)
Glucose-Capillary: 98 mg/dL (ref 65–99)

## 2015-09-15 LAB — VANCOMYCIN, TROUGH: VANCOMYCIN TR: 38 ug/mL — AB (ref 10.0–20.0)

## 2015-09-15 MED ORDER — VANCOMYCIN HCL 10 G IV SOLR
1250.0000 mg | INTRAVENOUS | Status: AC
  Administered 2015-09-15: 1250 mg via INTRAVENOUS
  Filled 2015-09-15: qty 1250

## 2015-09-15 MED ORDER — SODIUM CHLORIDE 0.9 % IV BOLUS (SEPSIS)
500.0000 mL | Freq: Once | INTRAVENOUS | Status: AC
  Administered 2015-09-15: 500 mL via INTRAVENOUS

## 2015-09-15 NOTE — Progress Notes (Signed)
Patient ID: Daniel Massey, male   DOB: [birth year], 24 y.o.   MRN: [id number] Patient looks quite well.  His range of motion is improved.  He has no new problems.  He has excellent range of motion.  He has full sensation to the tip of his finger.  Cultures show staph aureus and sensitivities have been is observed.  I discussed with patient whirlpool tomorrow followed by transition to outpatient treatment with wet-to-dry dressing changes daily and oral antibiotics. He feels comfortable with this and after tomorrow's education hopefully we'll be able to transition to DC to home  Valaria Kohut M.D.

## 2015-09-15 NOTE — Progress Notes (Signed)
Called MD office with culture results. Abcess culture growing MRSA. Awaiting response from MD or PA

## 2015-09-15 NOTE — Progress Notes (Addendum)
ANTIBIOTIC CONSULT NOTE - FOLLOW UP  Pharmacy Consult for vancomycin and Zosyn Indication: Left hand/ring finger abscess with ascending cellulitis  Allergies  Allergen Reactions  . Penicillins     Just didn't work Has patient had a PCN reaction causing immediate rash, facial/tongue/throat swelling, SOB or lightheadedness with hypotension: unknown Has patient had a PCN reaction causing severe rash involving mucus membranes or skin necrosis: unknown Has patient had a PCN reaction that required hospitalization: Unknown Has patient had a PCN reaction occurring within the last 10 years: Unknown If all of the above answers are "NO", then may proceed with Cephalosporin use.     Patient Measurements: Height: 6\' 1"  (185.4 cm) Weight: 290 lb (131.543 kg) IBW/kg (Calculated) : 79.9  Vital Signs: Temp: 98.5 F (36.9 C) (12/02 0422) Temp Source: Oral (12/02 0422) BP: 146/84 mmHg (12/02 0422) Pulse Rate: 114 (12/02 0422) Intake/Output from previous day: 12/01 0701 - 12/02 0700 In: 1730 [P.O.:1080; IV Piggyback:650] Out: -  Intake/Output from this shift:    Labs:  Recent Labs  09/13/15 0512  CREATININE 0.88   Estimated Creatinine Clearance: 184 mL/min (by C-G formula based on Cr of 0.88).  [facility]  [facility]15 0[facility]     Microbiology: Recent Results (from the past 720 hour(s))  Anaerobic culture     Status: None (Preliminary result)   Collection Time: 09/12/15  5:59 PM  Result Value Ref Range Status   Specimen Description ABSCESS LEFT FINGER  Final   Special Requests PATIENT ON FOLLOWING VANCOMYCIN  Final   Gram Stain   Final    ABUNDANT WBC PRESENT, PREDOMINANTLY PMN NO SQUAMOUS EPITHELIAL CELLS SEEN RARE GRAM POSITIVE COCCI IN PAIRS IN CLUSTERS Performed at [facility]    Culture PENDING  Incomplete   Report Status PENDING  Incomplete  Culture, routine-abscess     Status: None (Preliminary result)   Collection Time: 09/12/15  5:59 PM   Result Value Ref Range Status   Specimen Description ABSCESS LEFT FINGER  Final   Special Requests PATIENT ON FOLLOWING VANCOMYCIN  Final   Gram Stain   Final    ABUNDANT WBC PRESENT, PREDOMINANTLY PMN NO SQUAMOUS EPITHELIAL CELLS SEEN RARE GRAM POSITIVE COCCI IN PAIRS IN CLUSTERS Performed at [facility]    Culture   Final    ABUNDANT STAPHYLOCOCCUS AUREUS Note: RIFAMPIN AND GENTAMICIN SHOULD NOT BE USED AS SINGLE DRUGS FOR TREATMENT OF STAPH INFECTIONS. Performed at [facility]    Report Status PENDING  Incomplete    Anti-infectives    Start     Dose/Rate Route Frequency Ordered Stop   09/12/15 2300  vancomycin (VANCOCIN) 1,250 mg in sodium chloride 0.9 % 250 mL IVPB  Status:  Discontinued     1,250 mg 166.7 mL/hr over 90 Minutes Intravenous Every 8 hours 09/12/15 2105 09/15/15 0902   09/12/15 2130  piperacillin-tazobactam (ZOSYN) IVPB 3.375 g     3.375 g 12.5 mL/hr over 240 Minutes Intravenous 3 times per day 09/12/15 2105     09/12/15 1725  vancomycin (VANCOCIN) 1 GM/200ML IVPB    Comments:  Alferd ApaFaries, Bridget   : cabinet override      09/12/15 1725 09/12/15 1802      Assessment: 24 y/o obese male on vancomycin and Zosyn for left hand/ring finger abscess and cellulitis s/p I&D. Vancomycin trough drawn this morning is SUPRAtherapeutic at 38 and is out of proportion for weight and renal function. Last SCr was normal on 11/30, UOP is  not recorded. Left finger abscess growing abundant Staph aureus, sensitivities pending. Tm 99.7, WBC has not been redrawn since admit.  Vancomycin 11/29 >>  Zosyn 11/29 >>   Goal of Therapy:  Vancomycin trough level 15-20 mcg/ml  Plan:  - Discontinue scheduled vancomycin dose for now, will re-dose when level back to therapeutic range - Add on BMP to labs this morning - Random vancomycin level at 14:30 - Continue Zosyn 3.375 g IV q8h to be infused over 4 hours  [facility], [place].D., BCPS Clinical Pharmacist Pager:  [phone] 09/15/2015 9:16 AM   Addendum: SCr more than doubled (0.88>>2.41). Medications reviewed.  Called Dr. Carlos Levering office and left message - recommend d/c lisinopril until renal function improves.  [place], [address].D., BCPS Clinical Pharmacist Pager: [phone] 09/15/2015 10:16 AM

## 2015-09-15 NOTE — Telephone Encounter (Signed)
I called and spoke with the patient. I told him I was sorry he was still in the hospital. He stated he hoped to get out of the hospital tomorrow. I told him I would be praying for good results and hope he would get out tomorrow.

## 2015-09-15 NOTE — Progress Notes (Signed)
Critical lab value taken from Dorann LodgeJamie Harrison of [facility]  Abcess culture with MRSA.  Reported to Duwayne Heckanielle, head RN for patient.

## 2015-09-15 NOTE — Progress Notes (Addendum)
Physical Therapy Wound Evaluation and Treatment Patient Details  Name: Daniel Massey MRN: [id number] Date of Birth: 08-Apr-1991  Today's Date: 09/15/2015 Time: 1224-8250 Time Calculation (min): 30 min  Subjective  Subjective: Does this hurt at all? Patient and Family Stated Goals: Heal finger Date of Onset:  (admitted 11/29) Prior Treatments: S/p I&D 11/29  Pain Score: Pre-medicated. No pain at rest. Reports slight increase in pain during hydrotherapy treatment.  Wound Assessment  Wound / Incision (Open or Dehisced) 09/15/15 Incision - Open Finger (Comment which one) Left Ring finger, volar surface (Active)  Dressing Type Gauze (Comment);Moist to dry;Compression wrap 09/15/2015 11:00 AM  Dressing Changed Changed 09/15/2015 11:00 AM  Dressing Status Clean;Dry;Intact 09/15/2015 11:00 AM  Dressing Change Frequency Daily 09/15/2015 11:00 AM  Site / Wound Assessment Red;Painful;Pink;Yellow 09/15/2015 11:00 AM  % Wound base Red or Granulating 80% 09/15/2015 11:00 AM  % Wound base Yellow 10% 09/15/2015 11:00 AM  % Wound base Black 10% 09/15/2015 11:00 AM  Peri-wound Assessment Edema;Maceration;Pink 09/15/2015 11:00 AM  Wound Length (cm) 1.8 cm 09/15/2015 11:00 AM  Wound Width (cm) 0.6 cm 09/15/2015 11:00 AM  Wound Depth (cm) 0.5 cm 09/15/2015 11:00 AM  Margins Unattached edges (unapproximated) 09/15/2015 11:00 AM  Closure None 09/15/2015 11:00 AM  Drainage Amount Moderate 09/15/2015 11:00 AM  Drainage Description Serosanguineous;Purulent;No odor 09/15/2015 11:00 AM  Treatment Hydrotherapy (Pulse lavage);Packing (Saline gauze);Other (Comment) 09/15/2015 11:00 AM      Hydrotherapy Pulsed lavage therapy - wound location: Left hand, volar surface of ring finger Pulsed Lavage with Suction (psi): 4 psi Pulsed Lavage with Suction - Normal Saline Used: 1000 mL Pulsed Lavage Tip: Tip with splash shield   Wound Assessment and Plan  Wound Therapy - Assess/Plan/Recommendations Wound Therapy - Clinical  Statement: Presents to hydrotherapy s/p I&D of left hand, ring finger upon the volar surface. Pt reports reduction in overall hand edema this date. Wound gently cleansed with hydrotherapy and swab tip to remove coagulated blood and very minimal yellow slough. Tolerated treatment well. Significant other present and both were educated on dressing changes using normal saline with moist 2x2 gauze, kerlix, and compression wrap. Instructed on gentle ROM exercises for digits. Wound Therapy - Functional Problem List: Edema and limiting ROM and functional use of left hand. Factors Delaying/Impairing Wound Healing: Infection - systemic/local Hydrotherapy Plan: Debridement;Dressing change;Patient/family education;Pulsatile lavage with suction Wound Therapy - Frequency: Other (comment) (Daily) Wound Therapy - Follow Up Recommendations:  (Per surgery) Wound Plan: Continue with hydrotherapy, dressing changes, and patient education for autonomus wound care.  Wound Therapy Goals- Improve the function of patient's integumentary system by progressing the wound(s) through the phases of wound healing (inflammation - proliferation - remodeling) by: Decrease Necrotic Tissue to: 0 Decrease Necrotic Tissue - Progress: Goal set today Increase Granulation Tissue to: 100 Increase Granulation Tissue - Progress: Goal set today Improve Drainage Characteristics: Min;Serous Improve Drainage Characteristics - Progress: Goal set today Goals/treatment plan/discharge plan were made with and agreed upon by patient/family: Yes Time For Goal Achievement: 7 days Wound Therapy - Potential for Goals: Good  Goals will be updated until maximal potential achieved or discharge criteria met.  Discharge criteria: when goals achieved, discharge from hospital, MD decision/surgical intervention, no progress towards goals, refusal/missing three consecutive treatments without notification or medical reason.  GP     Candie Mile S 09/15/2015,  11:26 AM  Elayne Snare, [place]

## 2015-09-15 NOTE — Progress Notes (Signed)
CRITICAL VALUE ALERT  Critical value received:  Vanc Trough=38  Date of notification:  09/15/15  Time of notification:  0820  Critical value read back:Yes.    Nurse who received alert:  Caprice Beaveranielle Khylie Larmore    MD notified (1st page):  Karie ChimeraBrian Buchanan  Time of first page:  0825  MD notified (2nd page):  Time of second page:  Responding MD:  Karie ChimeraBrian Buchanan  Time MD responded:  0825. No new orders at this time. 0700 vancomycin was not given

## 2015-09-15 NOTE — Progress Notes (Signed)
ANTIBIOTIC CONSULT NOTE - FOLLOW UP  Pharmacy Consult for vancomycin Indication: Left hand/ring finger abscess with ascending cellulitis  Allergies  Allergen Reactions  . Penicillins     Just didn't work Has patient had a PCN reaction causing immediate rash, facial/tongue/throat swelling, SOB or lightheadedness with hypotension: unknown Has patient had a PCN reaction causing severe rash involving mucus membranes or skin necrosis: unknown Has patient had a PCN reaction that required hospitalization: Unknown Has patient had a PCN reaction occurring within the last 10 years: Unknown If all of the above answers are "NO", then may proceed with Cephalosporin use.     Patient Measurements: Height: 6\' 1"  (185.4 cm) Weight: 290 lb (131.543 kg) IBW/kg (Calculated) : 79.9 Adjusted Body Weight:   Vital Signs:   Intake/Output from previous day: 12/01 0701 - 12/02 0700 In: 1730 [P.O.:1080; IV Piggyback:650] Out: -  Intake/Output from this shift:    Labs:  Recent Labs  09/13/15 0512 09/15/15 0709  CREATININE 0.88 2.41*   Estimated Creatinine Clearance: 67.2 mL/min (by C-G formula based on Cr of 2.41).  Recent Labs  09/15/15 0709 09/15/15 1705  VANCOTROUGH 38*  --   VANCORANDOM  --  17     Microbiology: Recent Results (from the past 720 hour(s))  Anaerobic culture     Status: None (Preliminary result)   Collection Time: 09/12/15  5:59 PM  Result Value Ref Range Status   Specimen Description ABSCESS LEFT FINGER  Final   Special Requests PATIENT ON FOLLOWING VANCOMYCIN  Final   Gram Stain   Final    ABUNDANT WBC PRESENT, PREDOMINANTLY PMN NO SQUAMOUS EPITHELIAL CELLS SEEN RARE GRAM POSITIVE COCCI IN PAIRS IN CLUSTERS Performed at [facility]    Culture PENDING  Incomplete   Report Status PENDING  Incomplete  Culture, routine-abscess     Status: None   Collection Time: 09/12/15  5:59 PM  Result Value Ref Range Status   Specimen Description ABSCESS LEFT  FINGER  Final   Special Requests PATIENT ON FOLLOWING VANCOMYCIN  Final   Gram Stain   Final    ABUNDANT WBC PRESENT, PREDOMINANTLY PMN NO SQUAMOUS EPITHELIAL CELLS SEEN RARE GRAM POSITIVE COCCI IN PAIRS IN CLUSTERS Performed at [facility]    Culture   Final    ABUNDANT METHICILLIN RESISTANT STAPHYLOCOCCUS AUREUS Note: RIFAMPIN AND GENTAMICIN SHOULD NOT BE USED AS SINGLE DRUGS FOR TREATMENT OF STAPH INFECTIONS. This organism DOES NOT demonstrate inducible Clindamycin resistance in vitro. CRITICAL RESULT CALLED TO, READ BACK BY AND VERIFIED WITH: Pricilla RiffleDRIAN B. 11:11AM  09/15/15 HAJAM Performed at [facility]    Report Status 09/15/2015 FINAL  Final   Organism ID, Bacteria METHICILLIN RESISTANT STAPHYLOCOCCUS AUREUS  Final      Susceptibility   Methicillin resistant staphylococcus aureus - MIC*    CLINDAMYCIN <=0.25 SENSITIVE Sensitive     ERYTHROMYCIN >=8 RESISTANT Resistant     GENTAMICIN <=0.5 SENSITIVE Sensitive     LEVOFLOXACIN 0.25 SENSITIVE Sensitive     OXACILLIN >=4 RESISTANT Resistant     RIFAMPIN <=0.5 SENSITIVE Sensitive     TRIMETH/SULFA <=10 SENSITIVE Sensitive     VANCOMYCIN 1 SENSITIVE Sensitive     TETRACYCLINE <=1 SENSITIVE Sensitive     * ABUNDANT METHICILLIN RESISTANT STAPHYLOCOCCUS AUREUS    Anti-infectives    Start     Dose/Rate Route Frequency Ordered Stop   09/15/15 1815  vancomycin (VANCOCIN) 1,250 mg in sodium chloride 0.9 % 250 mL IVPB     1,250  mg 166.7 mL/hr over 90 Minutes Intravenous STAT 09/15/15 1806 09/16/15 1815   09/12/15 2300  vancomycin (VANCOCIN) 1,250 mg in sodium chloride 0.9 % 250 mL IVPB  Status:  Discontinued     1,250 mg 166.7 mL/hr over 90 Minutes Intravenous Every 8 hours 09/12/15 2105 09/15/15 0902   09/12/15 2130  piperacillin-tazobactam (ZOSYN) IVPB 3.375 g     3.375 g 12.5 mL/hr over 240 Minutes Intravenous 3 times per day 09/12/15 2105     09/12/15 1725  vancomycin (VANCOCIN) 1 GM/200ML IVPB    Comments:   Alferd Apa   : cabinet override      09/12/15 1725 09/12/15 1802      Assessment: 24 yo male with left hand/ring finger abscess with ascending cellulitis is currently on therapeutic vancomycin after holding doses today.  Vancomycin random is now 17 down from 38.  Last vancomycin dose was at 0023 on 09/15/15.  SCr is up to 2.41 from 0.88  Goal of Therapy:  Vancomycin trough level 15-20 mcg/ml  Plan:  - vancomycin 1250 mg iv x1 now - redraw vancomycin random level tomorrow at 0700 to re-evaluate further dosing  Deletha Jaffee, Tsz-Yin 09/15/2015,6:06 PM

## 2015-09-16 LAB — BASIC METABOLIC PANEL WITH GFR
Anion gap: 11 (ref 5–15)
BUN: 15 mg/dL (ref 6–20)
CO2: 25 mmol/L (ref 22–32)
Calcium: 9.3 mg/dL (ref 8.9–10.3)
Chloride: 103 mmol/L (ref 101–111)
Creatinine, Ser: 2.36 mg/dL — ABNORMAL HIGH (ref 0.61–1.24)
GFR calc Af Amer: 43 mL/min — ABNORMAL LOW (ref >60)
GFR calc non Af Amer: 37 mL/min — ABNORMAL LOW (ref >60)
Glucose, Bld: 127 mg/dL — ABNORMAL HIGH (ref 65–99)
Potassium: 4.2 mmol/L (ref 3.5–5.1)
Sodium: 139 mmol/L (ref 135–145)

## 2015-09-16 LAB — VANCOMYCIN, RANDOM: VANCOMYCIN RM: 28 ug/mL

## 2015-09-16 LAB — GLUCOSE, CAPILLARY
Glucose-Capillary: 100 mg/dL — ABNORMAL HIGH (ref 65–99)
Glucose-Capillary: 116 mg/dL — ABNORMAL HIGH (ref 65–99)
Glucose-Capillary: 206 mg/dL — ABNORMAL HIGH (ref 65–99)

## 2015-09-16 MED ORDER — DOXYCYCLINE HYCLATE 50 MG PO CAPS: ORAL_CAPSULE | ORAL | Status: DC

## 2015-09-16 MED ORDER — OXYCODONE HCL 5 MG PO TABS: 5.0000 mg | ORAL_TABLET | ORAL | Status: DC | PRN

## 2015-09-16 MED ORDER — DOXYCYCLINE HYCLATE 100 MG PO CAPS: 100.0000 mg | ORAL_CAPSULE | Freq: Two times a day (BID) | ORAL | Status: DC

## 2015-09-16 NOTE — Discharge Instructions (Signed)
Please change her bandage twice a day as instructed by Dr. Amanda PeaGramig. This will be a simple wet-to-dry dressing as instructed at bedside please continue to take her antibiotics until they're finished. Dr. Carlos LeveringGramig's office will call for your follow-up appointment  Please call us for any problems   Keep bandage clean and dry.  Call for any problems.  No smoking.  Criteria for driving a car: you should be off your pain medicine for 7-8 hours, able to drive one handed(confident), thinking clearly and feeling able in your judgement to drive. Continue elevation as it will decrease swelling.  If instructed by MD move your fingers within the confines of the bandage/splint.  Use ice if instructed by your MD. Call immediately for any sudden loss of feeling in your hand/arm or change in functional abilities of the extremity.We recommend that you to take vitamin C 1000 mg a day to promote healing. We also recommend that if you require  pain medicine that you take a stool softener to prevent constipation as most pain medicines will have constipation side effects. We recommend either Peri-Colace or Senokot and recommend that you also consider adding MiraLAX to prevent the constipation affects from pain medicine if you are required to use them. These medicines are over the counter and maybe purchased at a local pharmacy. A cup of yogurt and a probiotic can also be helpful during the recovery process as the medicines can disrupt your intestinal environment.

## 2015-09-16 NOTE — Discharge Summary (Signed)
  Patient seen and examined.  Procedure note I performed irrigation and debridement of skin's obtained tissue. This was an excisional debridement with scissor and forceps. He tolerated this well. I explained wet-to-dry dressing changes and went over the plans.  He understands these issues and will continue the course of care.  He tolerated the procedure bedside.  I will discharge him today. He is grown out staph A sensitive to by mouth antibiotics. Please see chart for details.  Discharge medicines doxycycline 100 mg twice a day 4 weeks. RTC in my office in 3 days and I will call for his appointment. I will give him Norco when necessary pain and ask him to notify me should and problems occur.  Patient has been seen and examined. Patient has pain appropriate to his injury/process. Patient denies new complaints at this present time. I have discussed the care pathway with nursing staff. Patient is appropriate and alert.  We reviewed vital signs and intake output which are stable.  The upper extremity is neurovascularly intact. Refill is normal. There is no signs of compartment syndrome. There is no signs of dystrophy. There is normal sensation.  I have spent a  great deal of time discussing range of motion edema control and other techniques to decrease edema and promote flexion extension of the fingers. Patient understands the importance of elevation range of motion massage and other measures to lessen pain and prevent swelling.  We have also discussed immobilization to appropriate areas involved.  We have discussed with the patient shoulder range of motion to prevent adhesive capsulitis.  The remainder of the examination is normal today without complicating feature.    Patient will be discharged home. Will plan to see the patient back in the office as per discharge instructions (please see discharge instructions).  Patient had an uneventful hospital course. At the time of discharge  patient is stable awake alert and oriented in no acute distress. Regular diet will be continued and has been tolerated. Patient will notify should have problems occur. There is no signs of DVT infection or other complication at this juncture.  All questions have been incurred and answered.  Please see discharge med list Final diagnosis status post abscess left hand including palm and ring finger Daniel Delo MD

## 2015-09-16 NOTE — Progress Notes (Signed)
Hydrotherapy Cancellation Note  Patient Details Name: Daniel Massey MRN: [id number] DOB: July 12, 1991   Cancelled Treatment:    Reason Eval/Treat Not Completed: Noted Dr. Amanda PeaGramig recently performed irrigation, debridement, and dressing change. He noted he educated patient and wife on home care. Patient is being discharged. Spoke with patient and there is no indication to perform hydrotherapy prior to discharge.   Daniel Massey 09/16/2015, 11:54 AM  Pager [phone]

## 2015-09-16 NOTE — Progress Notes (Signed)
ANTIBIOTIC CONSULT NOTE - FOLLOW UP  Pharmacy Consult for vancomycin Indication: Left hand/ring finger abscess with ascending cellulitis MRSA  Allergies  Allergen Reactions  . Penicillins     Just didn't work Has patient had a PCN reaction causing immediate rash, facial/tongue/throat swelling, SOB or lightheadedness with hypotension: unknown Has patient had a PCN reaction causing severe rash involving mucus membranes or skin necrosis: unknown Has patient had a PCN reaction that required hospitalization: Unknown Has patient had a PCN reaction occurring within the last 10 years: Unknown If all of the above answers are "NO", then may proceed with Cephalosporin use.     Patient Measurements: Height: 6\' 1"  (185.4 cm) Weight: 290 lb (131.543 kg) IBW/kg (Calculated) : 79.9 Adjusted Body Weight:   Vital Signs: Temp: 98.9 F (37.2 C) (12/03 0552) Temp Source: Oral (12/03 0552) BP: 134/85 mmHg (12/03 0552) Pulse Rate: 94 (12/03 0552) Intake/Output from previous day: 12/02 0701 - 12/03 0700 In: 720 [P.O.:720] Out: -  Intake/Output from this shift:    Labs:  Recent Labs  09/15/15 0709 09/16/15 0640  CREATININE 2.41* 2.36*   Estimated Creatinine Clearance: 68.6 mL/min (by C-G formula based on Cr of 2.36).  Recent Labs  09/15/15 0709 09/15/15 1705 09/16/15 0640  VANCOTROUGH 38*  --   --   VANCORANDOM  --  17 28     Microbiology:     Assessment: 24 yo male with left hand/ring finger abscess with ascending cellulitis--MRSA.   Infectious Disease: V/Z for cellulitis/abscess finger s/p I&D. WBC 16.4. Tm 99.7  11/29 abscess: MRSA  Vancomycin 11/29 >> Zosyn 11/29 >>   12/2: VT 38 on 1.25g/q8-held 12/2 (1705): 17--1205mg  given x 1 12/3: VR 28   Goal of Therapy:  Vancomycin trough level 15-20 mcg/ml  Plan:  1. Possibly d/c home with oral abx today. 2. VR in AM if still here   Acsa Estey S. Merilynn Finlandobertson, PharmD, BCPS Clinical Staff Pharmacist Pager  [phone]  Misty Stanleyobertson, Dylan Monforte Stillinger 09/16/2015,8:00 AM

## 2015-09-17 LAB — ANAEROBIC CULTURE

## 2015-10-17 ENCOUNTER — Ambulatory Visit: Payer: BLUE CROSS/BLUE SHIELD | Admitting: Dietician

## 2015-10-23 ENCOUNTER — Telehealth: Payer: Self-pay | Admitting: Endocrinology

## 2015-10-23 MED ORDER — INSULIN GLARGINE 100 UNIT/ML SOLOSTAR PEN: 130.0000 [IU] | PEN_INJECTOR | Freq: Every day | SUBCUTANEOUS | Status: DC

## 2015-10-23 NOTE — Telephone Encounter (Signed)
Rx submitted per pt's request.  

## 2015-10-23 NOTE — Telephone Encounter (Signed)
Patient need a refill of Lantus send to [facility]

## 2015-10-26 ENCOUNTER — Telehealth: Payer: Self-pay | Admitting: Endocrinology

## 2015-10-26 MED ORDER — INSULIN PEN NEEDLE 32G X 4 MM MISC: Status: DC

## 2015-10-26 NOTE — Telephone Encounter (Signed)
[facility] calling regarding lantus solostar and pen needles, they need a separate pen needle rx for the ultra fine nano 32 g 4mm 180 for a 90day supply # [id number] ref # [id number]

## 2015-10-26 NOTE — Telephone Encounter (Signed)
Rx sent 

## 2015-10-27 ENCOUNTER — Telehealth: Payer: Self-pay | Admitting: Endocrinology

## 2015-10-27 MED ORDER — INSULIN GLARGINE 100 UNIT/ML SOLOSTAR PEN: 130.0000 [IU] | PEN_INJECTOR | Freq: Every day | SUBCUTANEOUS | Status: DC

## 2015-10-27 NOTE — Telephone Encounter (Signed)
See note below and please advise if ok to refill. I looked back at the last office notes and the instructions state 130 u. Thanks!

## 2015-10-27 NOTE — Telephone Encounter (Signed)
Rx refilled.

## 2015-10-27 NOTE — Telephone Encounter (Signed)
[facility] needs updated rx for lantus solostar please with the recent increase as noted in last office note 140 u

## 2015-10-27 NOTE — Telephone Encounter (Signed)
130/d. Please refill prn

## 2015-11-29 ENCOUNTER — Ambulatory Visit (INDEPENDENT_AMBULATORY_CARE_PROVIDER_SITE_OTHER): Payer: 59

## 2015-11-29 ENCOUNTER — Ambulatory Visit (INDEPENDENT_AMBULATORY_CARE_PROVIDER_SITE_OTHER): Payer: 59 | Admitting: Physician Assistant

## 2015-11-29 VITALS — BP 125/90 | HR 78 | Temp 98.7°F | Resp 16 | Ht 71.5 in | Wt 288.4 lb

## 2015-11-29 DIAGNOSIS — R110 Nausea: Secondary | ICD-10-CM | POA: Diagnosis not present

## 2015-11-29 DIAGNOSIS — R1033 Periumbilical pain: Secondary | ICD-10-CM

## 2015-11-29 DIAGNOSIS — R1032 Left lower quadrant pain: Secondary | ICD-10-CM | POA: Diagnosis not present

## 2015-11-29 LAB — POCT CBC
Granulocyte percent: 69.2 %G (ref 37–80)
HEMATOCRIT: 44.5 % (ref 43.5–53.7)
HEMOGLOBIN: 15.3 g/dL (ref 14.1–18.1)
LYMPH, POC: 2.6 (ref 0.6–3.4)
MCH, POC: 28.8 pg (ref 27–31.2)
MCHC: 34.4 g/dL (ref 31.8–35.4)
MCV: 83.8 fL (ref 80–97)
MID (CBC): 0.9 (ref 0–0.9)
MPV: 9.3 fL (ref 0–99.8)
POC GRANULOCYTE: 7.9 — AB (ref 2–6.9)
POC LYMPH %: 22.9 % (ref 10–50)
POC MID %: 7.9 % (ref 0–12)
Platelet Count, POC: 211 10*3/uL (ref 142–424)
RBC: 5.31 M/uL (ref 4.69–6.13)
RDW, POC: 14 %
WBC: 11.4 10*3/uL — AB (ref 4.6–10.2)

## 2015-11-29 LAB — POCT URINALYSIS DIP (MANUAL ENTRY)
BILIRUBIN UA: NEGATIVE
BILIRUBIN UA: NEGATIVE
Blood, UA: NEGATIVE
Glucose, UA: NEGATIVE
LEUKOCYTES UA: NEGATIVE
Nitrite, UA: NEGATIVE
PH UA: 6
Protein Ur, POC: NEGATIVE
Urobilinogen, UA: 0.2

## 2015-11-29 LAB — POC MICROSCOPIC URINALYSIS (UMFC): MUCUS RE: ABSENT

## 2015-11-29 MED ORDER — METRONIDAZOLE 500 MG PO TABS
500.0000 mg | ORAL_TABLET | Freq: Three times a day (TID) | ORAL | Status: DC
Start: 2015-11-29 — End: 2016-07-12

## 2015-11-29 MED ORDER — ONDANSETRON 8 MG PO TBDP: 8.0000 mg | ORAL_TABLET | Freq: Three times a day (TID) | ORAL | Status: DC | PRN

## 2015-11-29 MED ORDER — CIPROFLOXACIN HCL 500 MG PO TABS: 500.0000 mg | ORAL_TABLET | Freq: Two times a day (BID) | ORAL | Status: AC

## 2015-11-29 NOTE — Patient Instructions (Addendum)
Because you received an x-ray today, you will receive an invoice from [facility]. Please contact [facility] at [phone] with questions or concerns regarding your invoice. Our billing staff will not be able to assist you with those questions.  If your symptoms are not improving significantly in 48 hours, or if they worsen, please go to the emergency department.  Get plenty of rest and drink at least 64 ounces of water daily.

## 2015-11-29 NOTE — Progress Notes (Signed)
Patient ID: Daniel Massey, male    DOB: 30-Apr-1991, 25 y.o.   MRN: [id number]  PCP: No PCP Per Patient  Subjective:   Chief Complaint  Patient presents with  . Abdominal Pain    x 4 days  . Nausea    HPI Presents for evaluation of abdominal pain and nausea that began on 11/25/2015. Accompanied by his girlfriend, Lorane Gell.  Continuous, waxes and wanes, 3-5/10. Achy in quality.  Urge to defecate urgently, but nothing comes. Worse after eating. Some headache, but none since this morning. No alleviating factors.  No improvement after taking a laxative this morning, which did result in 3 stools. Stools are softer than usual, not diarrhea. BMs 2-3 x/day since onset on symptoms, but feels the urge more frequently. Some mucous, no blood in the stool. No melena.  No fever or chills. No vomiting. No muscle or joint pain. No urinary symptoms. No rash. No CP, SOB, dizziness. No visual changes.  Girlfriend had flu-like symptoms recently, but no nausea or abdominal pain or diarrhea. No other sick contacts.  Checks glucose at home. Fasting glucose this morning was 146. Next visit with Dr. Everardo All is 12/01/2015.     Review of Systems As above.    Patient Active Problem List   Diagnosis Date Noted  . DM (diabetes mellitus), type 2, uncontrolled (HCC) 09/04/2014  . Obesity, Class III, BMI 40-49.9 (morbid obesity) (HCC) 09/04/2014  . Essential hypertension 09/04/2014     Prior to Admission medications   Medication Sig Start Date End Date Taking? Authorizing Provider  glucose blood (ONETOUCH VERIO) test strip 1 each by Other route 2 (two) times daily. And lancets 2/day 07/27/15  Yes Romero Belling, MD  Insulin Glargine (LANTUS SOLOSTAR) 100 UNIT/ML Solostar Pen Inject 130 Units into the skin daily. And pen needles 2/day 10/27/15  Yes Romero Belling, MD  Insulin Pen Needle 32G X 4 MM MISC Use to inject insulin 2 times per day. 10/26/15  Yes Romero Belling, MD  lisinopril  (PRINIVIL,ZESTRIL) 10 MG tablet Take 1 tablet (10 mg total) by mouth daily. Patient not taking: Reported on 11/29/2015 11/24/14   Kyair Ditommaso, PA-C  NOVOLOG FLEXPEN 100 UNIT/ML FlexPen  08/26/15   Historical Provider, MD     Allergies  Allergen Reactions  . Penicillins     Just didn't work Has patient had a PCN reaction causing immediate rash, facial/tongue/throat swelling, SOB or lightheadedness with hypotension: unknown Has patient had a PCN reaction causing severe rash involving mucus membranes or skin necrosis: unknown Has patient had a PCN reaction that required hospitalization: Unknown Has patient had a PCN reaction occurring within the last 10 years: Unknown If all of the above answers are "NO", then may proceed with Cephalosporin use.        Objective:  Physical Exam  Constitutional: He is oriented to person, place, and time. He appears well-developed and well-nourished. He is active and cooperative. No distress.  BP 125/90 mmHg  Pulse 78  Temp(Src) 98.7 F (37.1 C) (Oral)  Resp 16  Ht 5' 11.5" (1.816 m)  Wt 288 lb 6.4 oz (130.817 kg)  BMI 39.67 kg/m2  SpO2 98%  HENT:  Head: Normocephalic and atraumatic.  Right Ear: Hearing normal.  Left Ear: Hearing normal.  Eyes: Conjunctivae are normal. No scleral icterus.  Neck: Normal range of motion. Neck supple. No thyromegaly present.  Cardiovascular: Normal rate, regular rhythm and normal heart sounds.   Pulses:      Radial pulses are 2+ on  the right side, and 2+ on the left side.  Pulmonary/Chest: Effort normal and breath sounds normal.  Abdominal: Soft. Bowel sounds are normal. He exhibits no distension and no mass. There is no hepatosplenomegaly. There is tenderness (mildly, in the LEFT lower and mid-abdomen). There is no rebound and no guarding.  Lymphadenopathy:       Head (right side): No tonsillar, no preauricular, no posterior auricular and no occipital adenopathy present.       Head (left side): No tonsillar, no  preauricular, no posterior auricular and no occipital adenopathy present.    He has no cervical adenopathy.       Right: No supraclavicular adenopathy present.       Left: No supraclavicular adenopathy present.  Neurological: He is alert and oriented to person, place, and time. No sensory deficit.  Skin: Skin is warm, dry and intact. No rash noted. No cyanosis or erythema. Nails show no clubbing.  Psychiatric: He has a normal mood and affect. His speech is normal and behavior is normal.       Results for orders placed or performed in visit on 11/29/15  POCT CBC  Result Value Ref Range   WBC 11.4 (A) 4.6 - 10.2 K/uL   Lymph, poc 2.6 0.6 - 3.4   POC LYMPH PERCENT 22.9 10 - 50 %L   MID (cbc) 0.9 0 - 0.9   POC MID % 7.9 0 - 12 %M   POC Granulocyte 7.9 (A) 2 - 6.9   Granulocyte percent 69.2 37 - 80 %G   RBC 5.31 4.69 - 6.13 M/uL   Hemoglobin 15.3 14.1 - 18.1 g/dL   HCT, POC 16.1 09.6 - 53.7 %   MCV 83.8 80 - 97 fL   MCH, POC 28.8 27 - 31.2 pg   MCHC 34.4 31.8 - 35.4 g/dL   RDW, POC 04.5 %   Platelet Count, POC 211 142 - 424 K/uL   MPV 9.3 0 - 99.8 fL  POCT urinalysis dipstick  Result Value Ref Range   Color, UA yellow yellow   Clarity, UA clear clear   Glucose, UA negative negative   Bilirubin, UA negative negative   Ketones, POC UA negative negative   Spec Grav, UA >=1.030    Blood, UA negative negative   pH, UA 6.0    Protein Ur, POC negative negative   Urobilinogen, UA 0.2    Nitrite, UA Negative Negative   Leukocytes, UA Negative Negative  POCT Microscopic Urinalysis ([facility])  Result Value Ref Range   WBC,UR,HPF,POC None None WBC/hpf   RBC,UR,HPF,POC None None RBC/hpf   Bacteria Few (A) None, Too numerous to count   Mucus Absent Absent   Epithelial Cells, UR Per Microscopy Few (A) None, Too numerous to count cells/hpf    Dg Abd Acute W/chest  11/29/2015  CLINICAL DATA:  Four day history of abdominal pain. EXAM: DG ABDOMEN ACUTE W/ 1V CHEST COMPARISON:  None.  FINDINGS: The lungs are clear wiithout focal pneumonia, edema, pneumothorax or pleural effusion. The cardiopericardial silhouette is within normal limits for size. The visualized bony structures of the thorax are intact. Upright film shows no evidence for intraperitoneal free air. There is no evidence for gaseous bowel dilation to suggest obstruction. Visualized bony anatomy is unremarkable. IMPRESSION: Negative abdominal radiographs.  No acute cardiopulmonary disease. Electronically Signed   By: Kennith Center M.D.   On: 11/29/2015 18:28       Assessment & Plan:   1. Left lower quadrant pain  No obstruction or intraperitoneal free air. Given mild elevation in WBC, elect to cover for possible diverticulitis. Rest, fluids, dietary fiber. If symptoms are not significantly improved in 24-48 hours, or if they worsen, go to the ED, as he would likely need CT. - POCT CBC - POCT urinalysis dipstick - POCT Microscopic Urinalysis ([facility]) - DG Abd Acute W/Chest; Future - ciprofloxacin (CIPRO) 500 MG tablet; Take 1 tablet (500 mg total) by mouth 2 (two) times daily.  Dispense: 14 tablet; Refill: 0 - metroNIDAZOLE (FLAGYL) 500 MG tablet; Take 1 tablet (500 mg total) by mouth 3 (three) times daily. DO NOT CONSUME ALCOHOL WHILE TAKING THIS MEDICATION.  Dispense: 21 tablet; Refill: 0  2. Nausea Zofran PRN. Advised to use stool softener, as well as dietary fiber and hydration, to reduce the risk of constipation. - ondansetron (ZOFRAN-ODT) 8 MG disintegrating tablet; Take 1 tablet (8 mg total) by mouth every 8 (eight) hours as needed for nausea.  Dispense: 15 tablet; Refill: 0   Fernande Bras, PA-C Physician Assistant-Certified [facility]

## 2015-12-01 ENCOUNTER — Ambulatory Visit: Payer: BLUE CROSS/BLUE SHIELD | Admitting: Endocrinology

## 2015-12-04 ENCOUNTER — Telehealth: Payer: Self-pay | Admitting: Endocrinology

## 2015-12-04 NOTE — Telephone Encounter (Signed)
Patient no showed today's appt. Please advise on how to follow up. °A. No follow up necessary. °B. Follow up urgent. Contact patient immediately. °C. Follow up necessary. Contact patient and schedule visit in ___ days. °D. Follow up advised. Contact patient and schedule visit in ____weeks. ° °

## 2015-12-04 NOTE — Telephone Encounter (Signed)
Please come back for a follow-up appointment in 2 weeks 

## 2015-12-05 NOTE — Telephone Encounter (Signed)
Caitlin, Could you please contact the pt and reschedule his appointment.

## 2015-12-19 ENCOUNTER — Ambulatory Visit: Payer: BLUE CROSS/BLUE SHIELD | Admitting: Endocrinology

## 2015-12-20 ENCOUNTER — Telehealth: Payer: Self-pay | Admitting: Endocrinology

## 2015-12-20 NOTE — Telephone Encounter (Signed)
Patient no showed today's appt. Please advise on how to follow up. °A. No follow up necessary. °B. Follow up urgent. Contact patient immediately. °C. Follow up necessary. Contact patient and schedule visit in ___ days. °D. Follow up advised. Contact patient and schedule visit in ____weeks. ° °

## 2015-12-20 NOTE — Telephone Encounter (Signed)
LVM for pt to call and r/s °

## 2015-12-20 NOTE — Telephone Encounter (Signed)
Caitlin, Could you please contact the pt and reschedule for 2 weeks.  Thanks!  

## 2015-12-20 NOTE — Telephone Encounter (Signed)
Please come back for a follow-up appointment in 2 weeks 

## 2016-05-18 ENCOUNTER — Other Ambulatory Visit: Payer: Self-pay | Admitting: Endocrinology

## 2016-05-19 NOTE — Telephone Encounter (Signed)
Please refill x 1 Ov is due  

## 2016-07-12 ENCOUNTER — Ambulatory Visit (INDEPENDENT_AMBULATORY_CARE_PROVIDER_SITE_OTHER): Payer: 59 | Admitting: Endocrinology

## 2016-07-12 ENCOUNTER — Encounter: Payer: Self-pay | Admitting: Endocrinology

## 2016-07-12 VITALS — BP 138/86 | HR 77 | Ht 73.0 in | Wt 301.0 lb

## 2016-07-12 DIAGNOSIS — E1165 Type 2 diabetes mellitus with hyperglycemia: Secondary | ICD-10-CM

## 2016-07-12 DIAGNOSIS — Z794 Long term (current) use of insulin: Secondary | ICD-10-CM

## 2016-07-12 DIAGNOSIS — IMO0001 Reserved for inherently not codable concepts without codable children: Secondary | ICD-10-CM

## 2016-07-12 LAB — POCT GLYCOSYLATED HEMOGLOBIN (HGB A1C): HEMOGLOBIN A1C: 10.9

## 2016-07-12 MED ORDER — BASAGLAR KWIKPEN 100 UNIT/ML ~~LOC~~ SOPN: 130.0000 [IU] | PEN_INJECTOR | Freq: Every day | SUBCUTANEOUS | 3 refills | Status: DC

## 2016-07-12 MED ORDER — INSULIN LISPRO 100 UNIT/ML (KWIKPEN): 20.0000 [IU] | PEN_INJECTOR | Freq: Three times a day (TID) | SUBCUTANEOUS | 11 refills | Status: DC

## 2016-07-12 NOTE — Patient Instructions (Addendum)
check your blood sugar twice a day.  vary the time of day when you check, between before the 3 meals, and at bedtime.  also check if you have symptoms of your blood sugar being too high or too low.  please keep a record of the readings and bring it to your next appointment here.  You can write it on any piece of paper.  please call us sooner if your blood sugar goes below 70, or if you have a lot of readings over 200.  On this type of insulin schedule, you should eat meals on a regular schedule.  If a meal is missed or significantly delayed, your blood sugar could go low.   Please continue the same basaglar, and:  Take humalog, 10-20 units, with as many meals as you can.   Please come back for a follow-up appointment in 2 months.

## 2016-07-12 NOTE — Progress Notes (Signed)
Subjective:    Patient ID: Daniel Massey, male    DOB: 04/01/1991, 25 y.o.   MRN: [id number]  HPI Pt returns for f/u of diabetes mellitus:  DM type: Insulin-requiring type 2.   Dx'ed: 2011 Complications: none Therapy: insulin since 2012 DKA: never Severe hypoglycemia: never. Pancreatitis: never. Other: he takes QD insulin, after poor results with multiple daily injections; he works at [employer], 3rd shift.  Interval history: he takes lantus, 130 units daily.  no cbg record, but states cbg's are mildly low if he misses a meal.  It is highest in the afternoon.  He says he never misses the insulin altogether Past Medical History:  Diagnosis Date  . Abscess of left hand 09/12/2015  . Diabetes mellitus without complication (HCC)   . Hypertension     Past Surgical History:  Procedure Laterality Date  . I&D EXTREMITY Left 09/12/2015   Procedure: IRRIGATION AND DEBRIDEMENT LEFT RING FINGER ABSCESS;  Surgeon: Dominica SeverinWilliam Gramig, MD;  Location: [facility] OR;  Service: Orthopedics;  Laterality: Left;  . ORIF ULNAR FRACTURE Right 08/2008    Social History   Social History  . Marital status: Single    Spouse name: girlfriend-Daniel Massey  . Number of children: 0  . Years of education: college   Occupational History  . MLT     [employer]   Social History Main Topics  . Smoking status: Never Smoker  . Smokeless tobacco: Never Used  . Alcohol use 1.2 oz/week    2 Standard drinks or equivalent per week  . Drug use: No  . Sexual activity: Yes    Partners: Female   Other Topics Concern  . Not on file   Social History Narrative   Graduate of [employer] with a degree in Biology.   Lives with his girlfriend, Daniel Massey.   [place] in [place], [place]    Current Outpatient Prescriptions on File Prior to Visit  Medication Sig Dispense Refill  . glucose blood (ONETOUCH VERIO) test strip 1 each by Other route 2 (two) times daily. And lancets 2/day 100 each 12  . Insulin Pen Needle 32G X 4 MM MISC Use to  inject insulin 2 times per day. 200 each 2  . LANTUS SOLOSTAR 100 UNIT/ML Solostar Pen Inject subcutaneously 130  units daily 120 mL 2  . ondansetron (ZOFRAN-ODT) 8 MG disintegrating tablet Take 1 tablet (8 mg total) by mouth every 8 (eight) hours as needed for nausea. 15 tablet 0   No current facility-administered medications on file prior to visit.     Allergies  Allergen Reactions  . Penicillins     Just didn't work Has patient had a PCN reaction causing immediate rash, facial/tongue/throat swelling, SOB or lightheadedness with hypotension: unknown Has patient had a PCN reaction causing severe rash involving mucus membranes or skin necrosis: unknown Has patient had a PCN reaction that required hospitalization: Unknown Has patient had a PCN reaction occurring within the last 10 years: Unknown If all of the above answers are "NO", then may proceed with Cephalosporin use.     Family History  Problem Relation Age of Onset  . Diabetes Mother   . Hypertension Mother   . Cancer Father 2850    colon  . Hypertension Father     BP 138/86   Pulse 77   Ht 6\' 1"  (1.854 m)   Wt (!) 301 lb (136.5 kg)   SpO2 99%   BMI 39.71 kg/m    Review of Systems He denies hypoglycemia  Objective:   Physical Exam VITAL SIGNS:  See vs page GENERAL: no distress Pulses: dorsalis pedis intact bilat.   MSK: no deformity of the feet CV: no leg edema.   Skin:  no ulcer on the feet.  normal color and temp on the feet. Neuro: sensation is intact to touch on the feet.    A1c=10.7%    Assessment & Plan:  Insulin-requiring type 2 DM: ongoing poor control.   Noncompliance with cbg recording and f/u ov's, worse: this raises ? of compliance with insulin injections.

## 2016-08-05 ENCOUNTER — Other Ambulatory Visit: Payer: Self-pay

## 2016-08-05 ENCOUNTER — Telehealth: Payer: Self-pay | Admitting: Endocrinology

## 2016-08-05 NOTE — Telephone Encounter (Signed)
Pt called and said that he needs a prescription for Lisinopril to [facility]

## 2016-08-05 NOTE — Telephone Encounter (Signed)
The chart indicates that the patient does not have a PCP.  I have seen this patient x 1, for acute abdominal pain and nausea, in 11/2015, and the patient has not been here since then. Previous visit in this office 08/2015, also for acute problem (cellulitis of the finger).  I did refill this medication 11/24/2014 (#30, RF x 2). He would have been out of this medication since 02/2015.  At his visit with Dr. Everardo AllEllison on 07/12/2016, BP was 138/86, and lisinopril was not on the medication list.  Generally, patients with diabetes are advised to take a medication in this family, and I would recommend that he take it. As his endocrinologist advised he obtain this from his PCP, please contact the patient and help him schedule a visit to establish for primary care. Before restarting this medication, he needs a BMET to assess his renal function (last done 09/2015, which revealed creatinine of 2.36 mg/dL).  Review of the record reveals he also needs a lipid profile, urine microalbumin, seasonal influenza vaccine, Tdap, pneumococcal vaccination (Pneumovax 23), diabetic eye exam and HIV screening.

## 2016-08-05 NOTE — Telephone Encounter (Signed)
Please refer this request to PCP 

## 2016-08-05 NOTE — Telephone Encounter (Signed)
He should set up appt with PCP, or go to urgent care

## 2016-09-08 NOTE — Progress Notes (Signed)
Subjective:    Patient ID: Daniel Massey, male    DOB: Jun 21, 1991, 25 y.o.   MRN: [id number]  HPI Pt returns for f/u of diabetes mellitus:  DM type: Insulin-requiring type 2.   Dx'ed: 2011 Complications: none Therapy: insulin since 2012 DKA: never Severe hypoglycemia: never. Pancreatitis: never. Other: he takes QD insulin, after poor results with multiple daily injections; he works at [employer], 3rd shift.  Interval history: he takes lantus, 120 units daily.  no cbg record, but states cbg's are seldom low.  He says this happens when he misses a meal.  He says he never misses the humalog, approx 2-3 doses per week, and the lantus approx 1 dose per week.   Past Medical History:  Diagnosis Date  . Abscess of left hand 09/12/2015  . Diabetes mellitus without complication (HCC)   . Hypertension     Past Surgical History:  Procedure Laterality Date  . I&D EXTREMITY Left 09/12/2015   Procedure: IRRIGATION AND DEBRIDEMENT LEFT RING FINGER ABSCESS;  Surgeon: Dominica SeverinWilliam Gramig, MD;  Location: [facility] OR;  Service: Orthopedics;  Laterality: Left;  . ORIF ULNAR FRACTURE Right 08/2008    Social History   Social History  . Marital status: Single    Spouse name: [name]  . Number of children: 0  . Years of education: college   Occupational History  . MLT     [employer]   Social History Main Topics  . Smoking status: Never Smoker  . Smokeless tobacco: Never Used  . Alcohol use 1.2 oz/week    2 Standard drinks or equivalent per week  . Drug use: No  . Sexual activity: Yes    Partners: Female   Other Topics Concern  . Not on file   Social History Narrative   Graduate of [employer] with a degree in Biology.   Lives with his girlfriend, [name].   [place] lives in [place], [place].    Current Outpatient Prescriptions on File Prior to Visit  Medication Sig Dispense Refill  . glucose blood (ONETOUCH VERIO) test strip 1 each by Other route 2 (two) times daily. And lancets 2/day 100 each  12   No current facility-administered medications on file prior to visit.     Allergies  Allergen Reactions  . Penicillins     Just didn't work Has patient had a PCN reaction causing immediate rash, facial/tongue/throat swelling, SOB or lightheadedness with hypotension: unknown Has patient had a PCN reaction causing severe rash involving mucus membranes or skin necrosis: unknown Has patient had a PCN reaction that required hospitalization: Unknown Has patient had a PCN reaction occurring within the last 10 years: Unknown If all of the above answers are "NO", then may proceed with Cephalosporin use.     Family History  Problem Relation Age of Onset  . Diabetes Mother   . Hypertension Mother   . Cancer Father 4350    colon  . Hypertension Father     BP 136/84   Pulse 89   Ht 6\' 1"  (1.854 m)   Wt 298 lb (135.2 kg)   SpO2 97%   BMI 39.32 kg/m    Review of Systems He denies LOC    Objective:   Physical Exam VITAL SIGNS:  See vs page GENERAL: no distress Pulses: dorsalis pedis intact bilat.   MSK: no deformity of the feet CV: no leg edema Skin:  no ulcer on the feet.  normal color and temp on the feet. Neuro: sensation is intact to touch on  the feet  A1c=9.9%    Assessment & Plan:  Insulin-requiring type 2 DM: ongoing poor control.   Noncompliance with cbg recording and insulin injections, ongoing.   Obesity, persistent.    Patient is advised the following: Patient Instructions  check your blood sugar twice a day.  vary the time of day when you check, between before the 3 meals, and at bedtime.  also check if you have symptoms of your blood sugar being too high or too low.  please keep a record of the readings and bring it to your next appointment here.  You can write it on any piece of paper.  please call us sooner if your blood sugar goes below 70, or if you have a lot of readings over 200.  On this type of insulin schedule, you should eat meals on a regular  schedule.  If a meal is missed or significantly delayed, your blood sugar could go low.   Please increase the lantus to 140 units per day, and: Take humalog, 10-20 units, with as many meals as you can.   Please come back for a follow-up appointment in 2 months.   Bariatric Surgery You have so much to gain by losing weight.  You may have already tried every diet and exercise plan imaginable.  And, you may have sought advice from your family physician, too.   Sometimes, in spite of such diligent efforts, you may not be able to achieve long-term results by yourself.  In cases of severe obesity, bariatric or weight loss surgery is a proven method of achieving long-term weight control.  Our Services Our bariatric surgery programs offer our patients new hope and long-term weight-loss solution.  Since introducing our services in 2003, we have conducted more than 2,400 successful procedures.  Our program is designated as a Investment banker, corporateComprehensive Center by the Metabolic and Bariatric Surgery Accreditation and Quality Improvement Program (MBSAQIP), a Child psychotherapistnational accrediting body that sets rigorous patient safety and outcome standards.  Our program is also designated as a Engineer, manufacturing systemsCenter of Excellence by [employer] insurance companies.   Our exceptional weight-loss surgery team specializes in diagnosis, treatment, follow-up care, and ongoing support for our patients with severe weight loss challenges.  We currently offer laparoscopic sleeve gastrectomy, gastric bypass, and adjustable gastric band (LAP-BAND).    Attend our Bariatrics Seminar Choosing to undergo a bariatric procedure is a big decision, and one that should not be taken lightly.  You now have two options in how you learn about weight-loss surgery - in person or online.  Our objective is to ensure you have all of the information that you need to evaluate the advantages and obligations of this life changing procedure.  Please note that you are not alone in this process, and our  experienced team is ready to assist and answer all of your questions.  There are several ways to register for a seminar (either on-line or in person): 1)  Call [phone] 2) Go on-line to [facility] and register for either type of seminar.  [email or web address]

## 2016-09-09 ENCOUNTER — Ambulatory Visit (INDEPENDENT_AMBULATORY_CARE_PROVIDER_SITE_OTHER): Payer: 59 | Admitting: Endocrinology

## 2016-09-09 ENCOUNTER — Encounter: Payer: Self-pay | Admitting: Endocrinology

## 2016-09-09 VITALS — BP 136/84 | HR 89 | Ht 73.0 in | Wt 298.0 lb

## 2016-09-09 DIAGNOSIS — IMO0001 Reserved for inherently not codable concepts without codable children: Secondary | ICD-10-CM

## 2016-09-09 DIAGNOSIS — Z794 Long term (current) use of insulin: Secondary | ICD-10-CM

## 2016-09-09 DIAGNOSIS — E1165 Type 2 diabetes mellitus with hyperglycemia: Secondary | ICD-10-CM | POA: Diagnosis not present

## 2016-09-09 LAB — POCT GLYCOSYLATED HEMOGLOBIN (HGB A1C): HEMOGLOBIN A1C: 9.9

## 2016-09-09 MED ORDER — INSULIN LISPRO 100 UNIT/ML (KWIKPEN): 20.0000 [IU] | PEN_INJECTOR | Freq: Three times a day (TID) | SUBCUTANEOUS | 11 refills | Status: DC

## 2016-09-09 MED ORDER — INSULIN GLARGINE 100 UNIT/ML SOLOSTAR PEN: 140.0000 [IU] | PEN_INJECTOR | Freq: Every day | SUBCUTANEOUS | 99 refills | Status: DC

## 2016-09-09 NOTE — Patient Instructions (Addendum)
check your blood sugar twice a day.  vary the time of day when you check, between before the 3 meals, and at bedtime.  also check if you have symptoms of your blood sugar being too high or too low.  please keep a record of the readings and bring it to your next appointment here.  You can write it on any piece of paper.  please call us sooner if your blood sugar goes below 70, or if you have a lot of readings over 200.  On this type of insulin schedule, you should eat meals on a regular schedule.  If a meal is missed or significantly delayed, your blood sugar could go low.   Please increase the lantus to 140 units per day, and: Take humalog, 10-20 units, with as many meals as you can.   Please come back for a follow-up appointment in 2 months.   Bariatric Surgery You have so much to gain by losing weight.  You may have already tried every diet and exercise plan imaginable.  And, you may have sought advice from your family physician, too.   Sometimes, in spite of such diligent efforts, you may not be able to achieve long-term results by yourself.  In cases of severe obesity, bariatric or weight loss surgery is a proven method of achieving long-term weight control.  Our Services Our bariatric surgery programs offer our patients new hope and long-term weight-loss solution.  Since introducing our services in 2003, we have conducted more than 2,400 successful procedures.  Our program is designated as a Investment banker, corporateComprehensive Center by the Metabolic and Bariatric Surgery Accreditation and Quality Improvement Program (MBSAQIP), a Child psychotherapistnational accrediting body that sets rigorous patient safety and outcome standards.  Our program is also designated as a Engineer, manufacturing systemsCenter of Excellence by [employer] insurance companies.   Our exceptional weight-loss surgery team specializes in diagnosis, treatment, follow-up care, and ongoing support for our patients with severe weight loss challenges.  We currently offer laparoscopic sleeve gastrectomy,  gastric bypass, and adjustable gastric band (LAP-BAND).    Attend our Bariatrics Seminar Choosing to undergo a bariatric procedure is a big decision, and one that should not be taken lightly.  You now have two options in how you learn about weight-loss surgery - in person or online.  Our objective is to ensure you have all of the information that you need to evaluate the advantages and obligations of this life changing procedure.  Please note that you are not alone in this process, and our experienced team is ready to assist and answer all of your questions.  There are several ways to register for a seminar (either on-line or in person): 1)  Call [phone] 2) Go on-line to [facility] and register for either type of seminar.  [email or web address]

## 2016-09-29 IMAGING — CR DG HAND COMPLETE 3+V*L*
3 series · 3 of 3 positions shown · non-contrast
Comparison: No priors.

CLINICAL DATA: 24-year-old male with pain and swelling in the left
ring finger for the past 2-3 days.

EXAM:
LEFT HAND - COMPLETE 3+ VIEW

[PA]
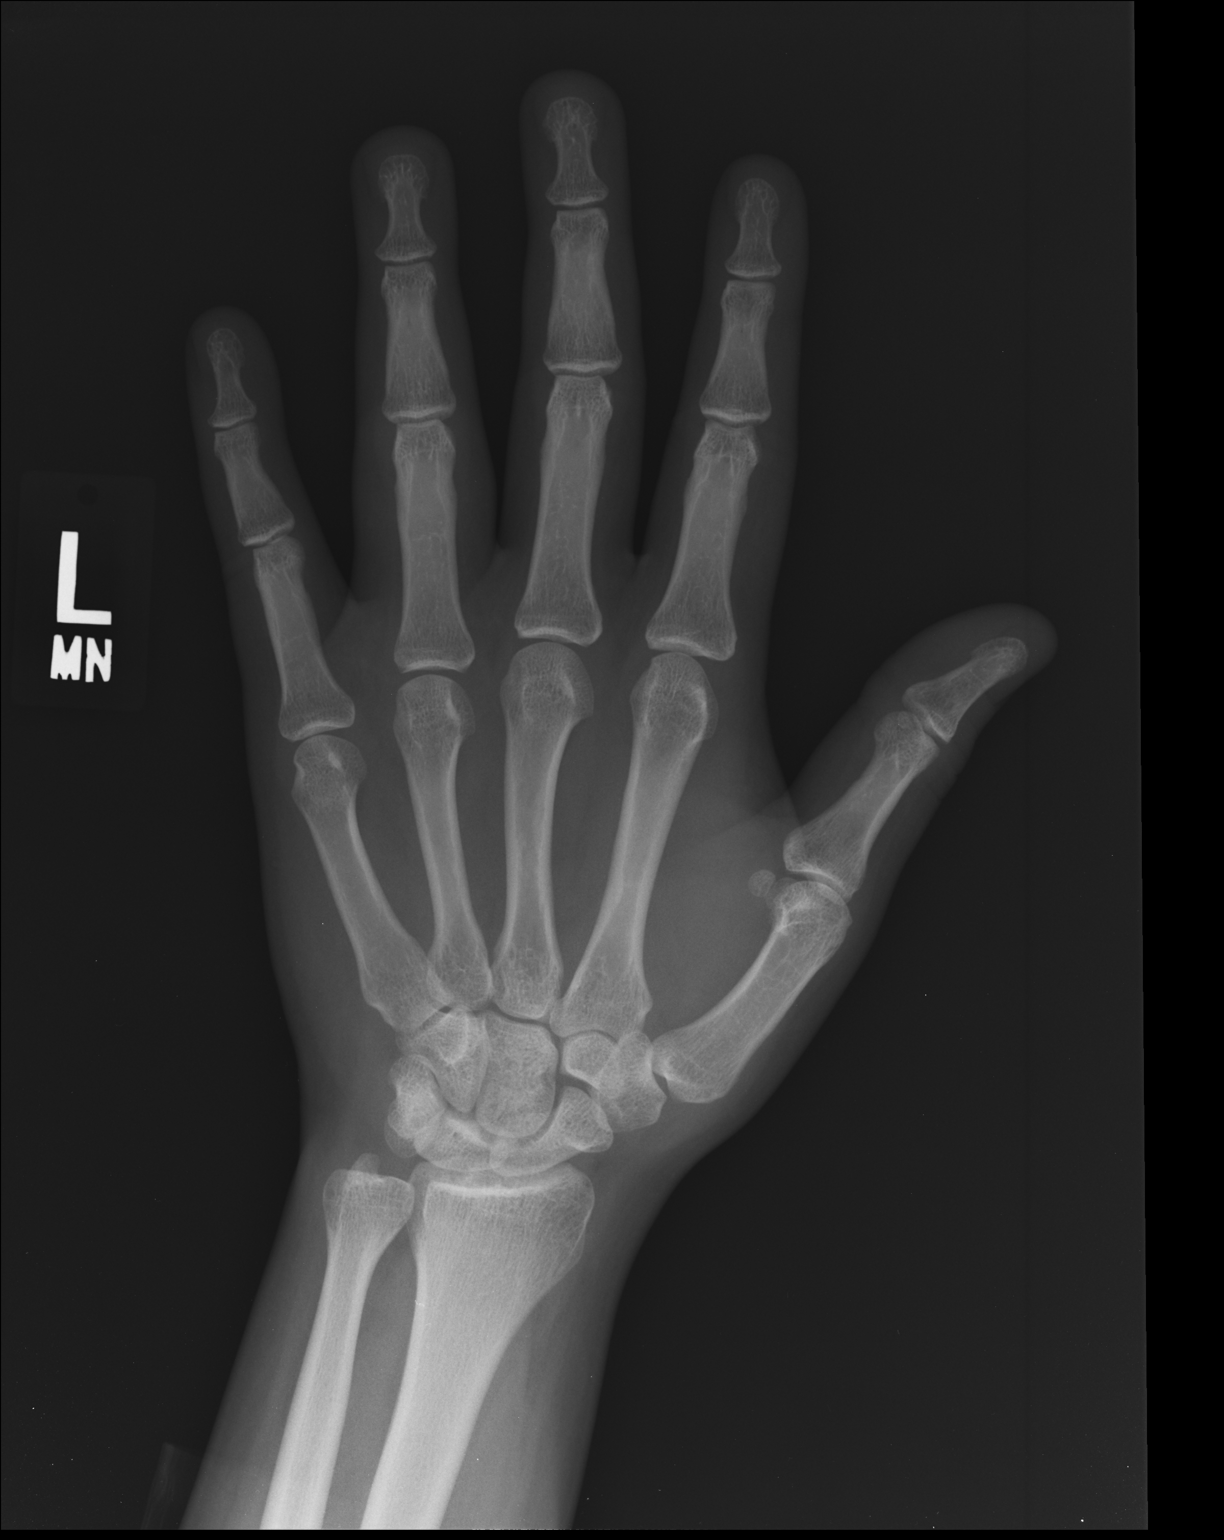

[lateral]
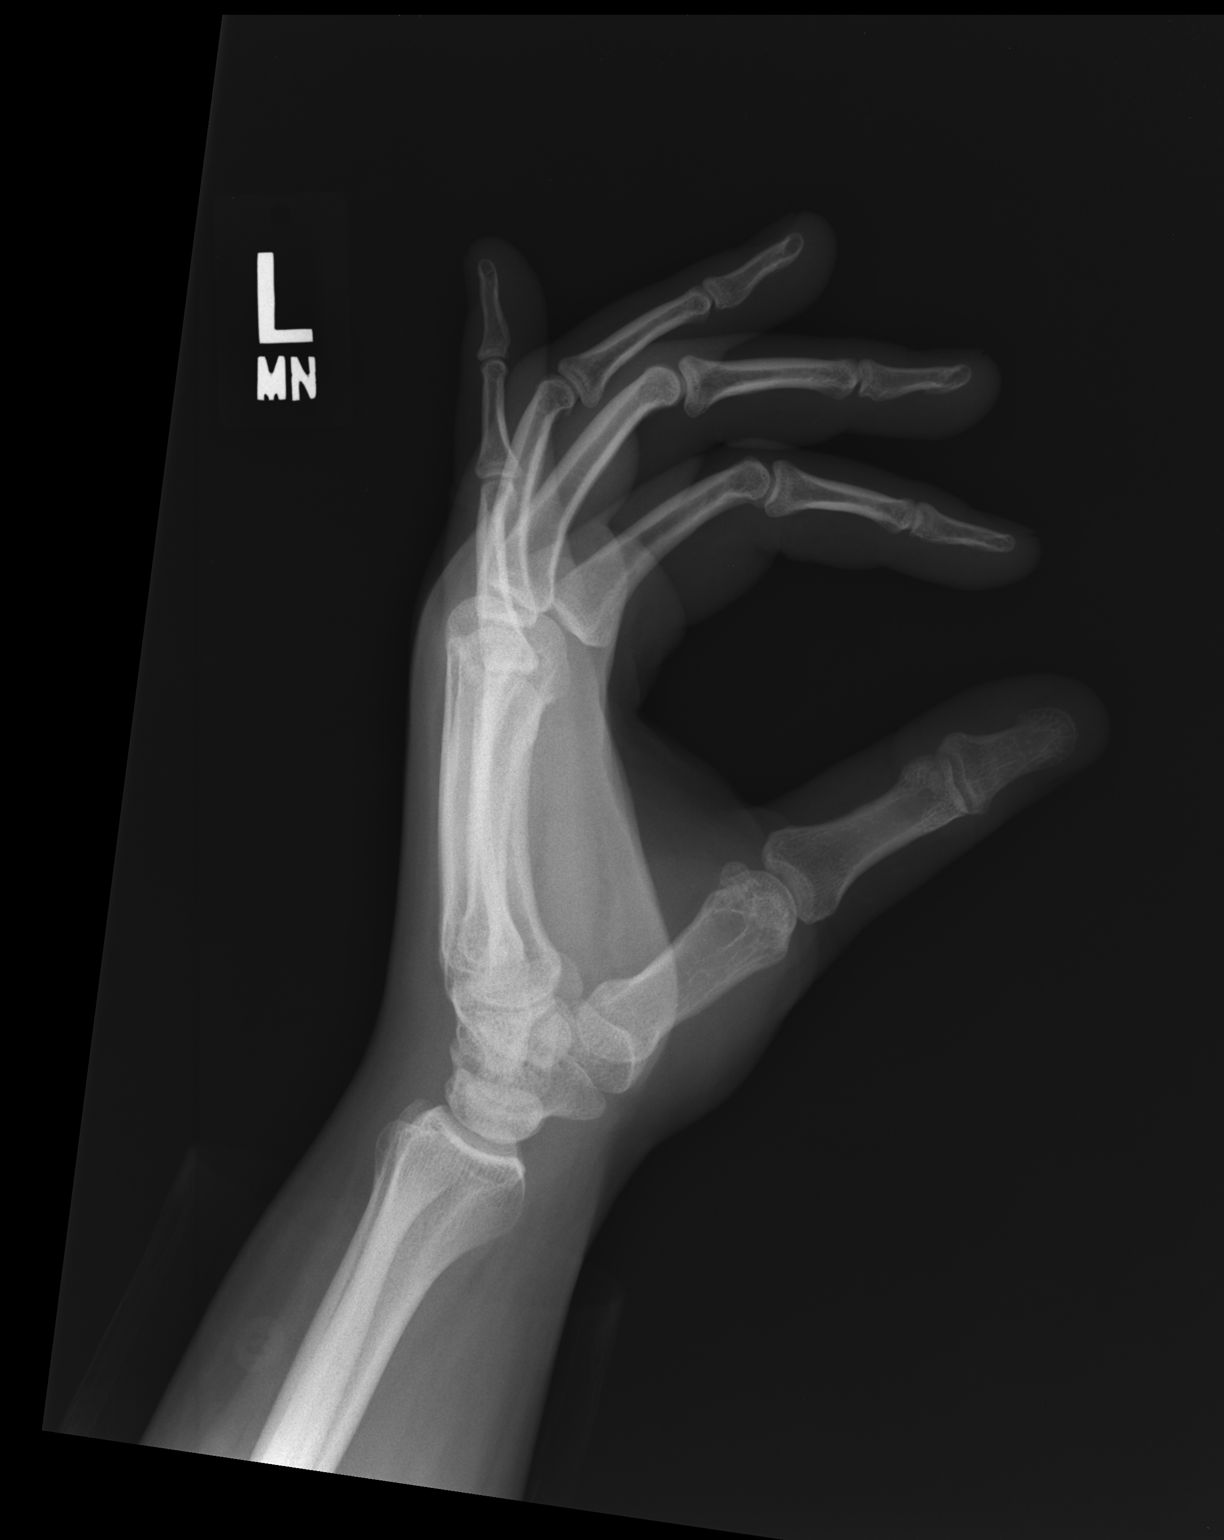

[pa obl]
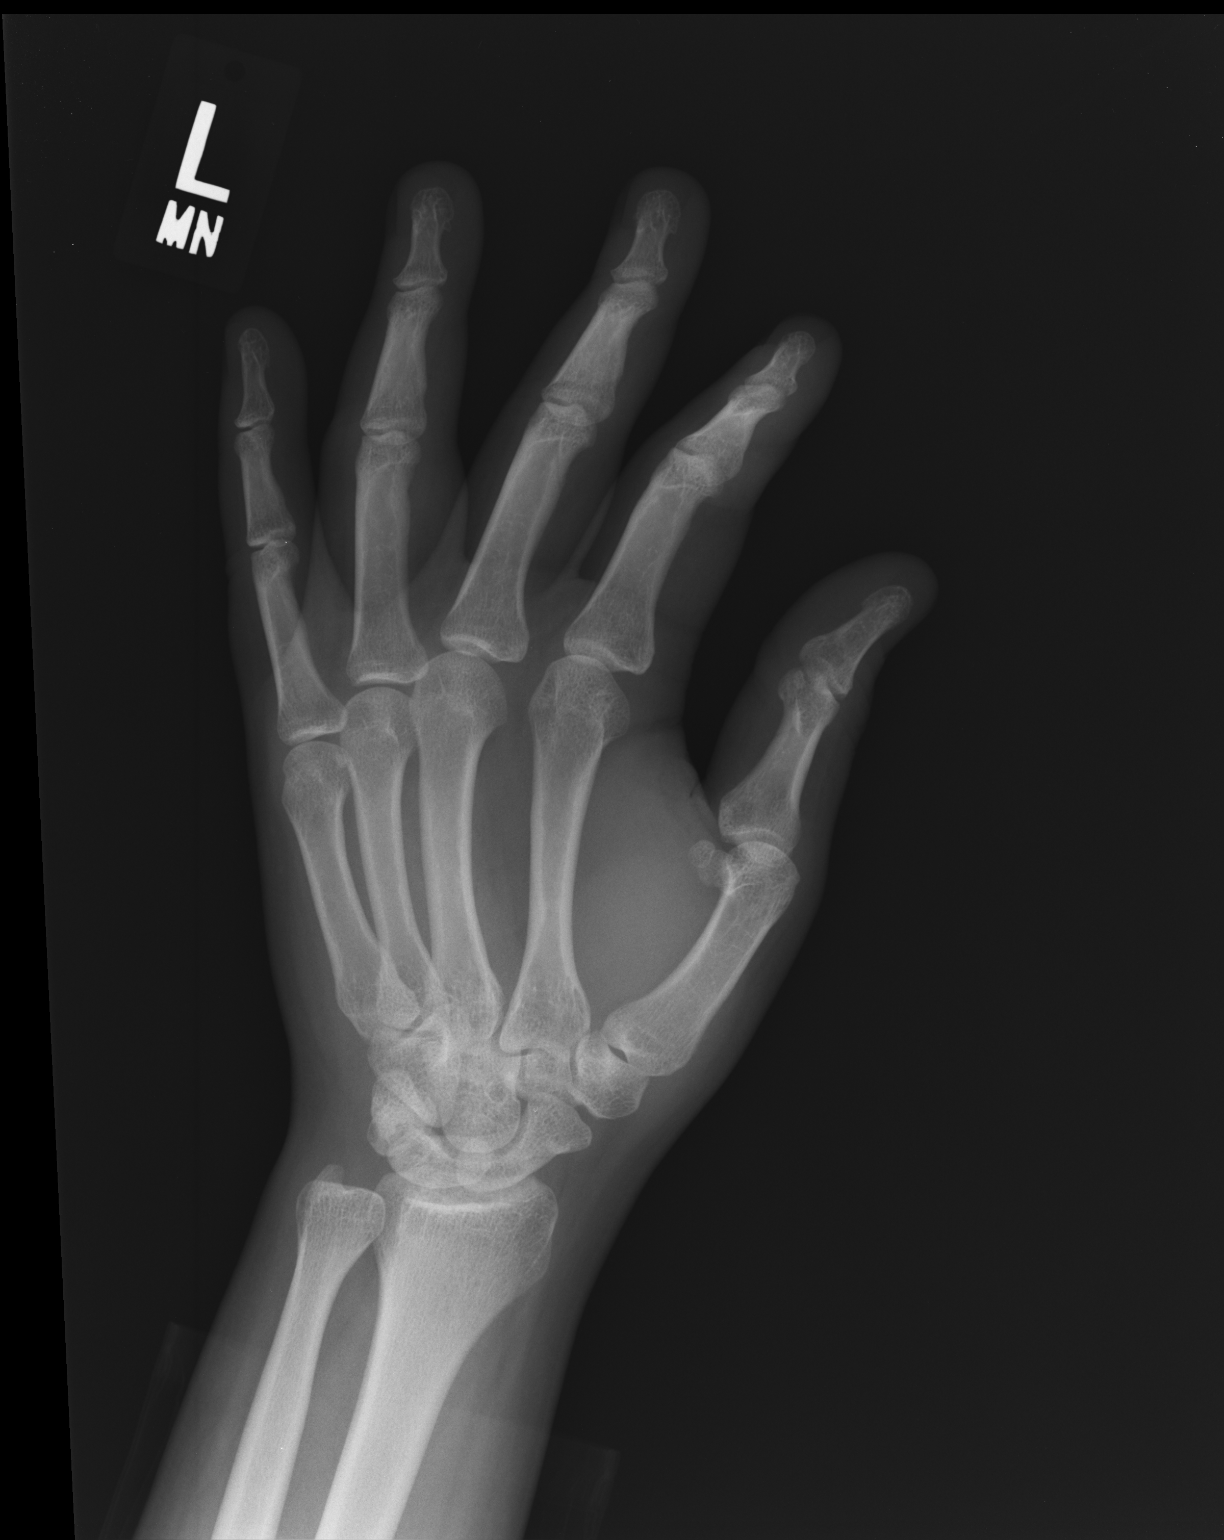

[3 of 3 positions shown; findings below may reference images not displayed]

FINDINGS: Multiple views of the left hand demonstrate no acute displaced
fracture, subluxation, dislocation, or soft tissue abnormality.
IMPRESSION: No acute radiographic abnormality of the left hand.

## 2016-09-29 IMAGING — CR DG FINGER RING 2+V*L*
1 series · 1 of 1 positions shown · non-contrast
Comparison: No priors.

CLINICAL DATA: 24-year-old male with history of pain and swelling
in the left ring finger for the past 2-3 days.

EXAM:
LEFT RING FINGER 2+V

[PA]
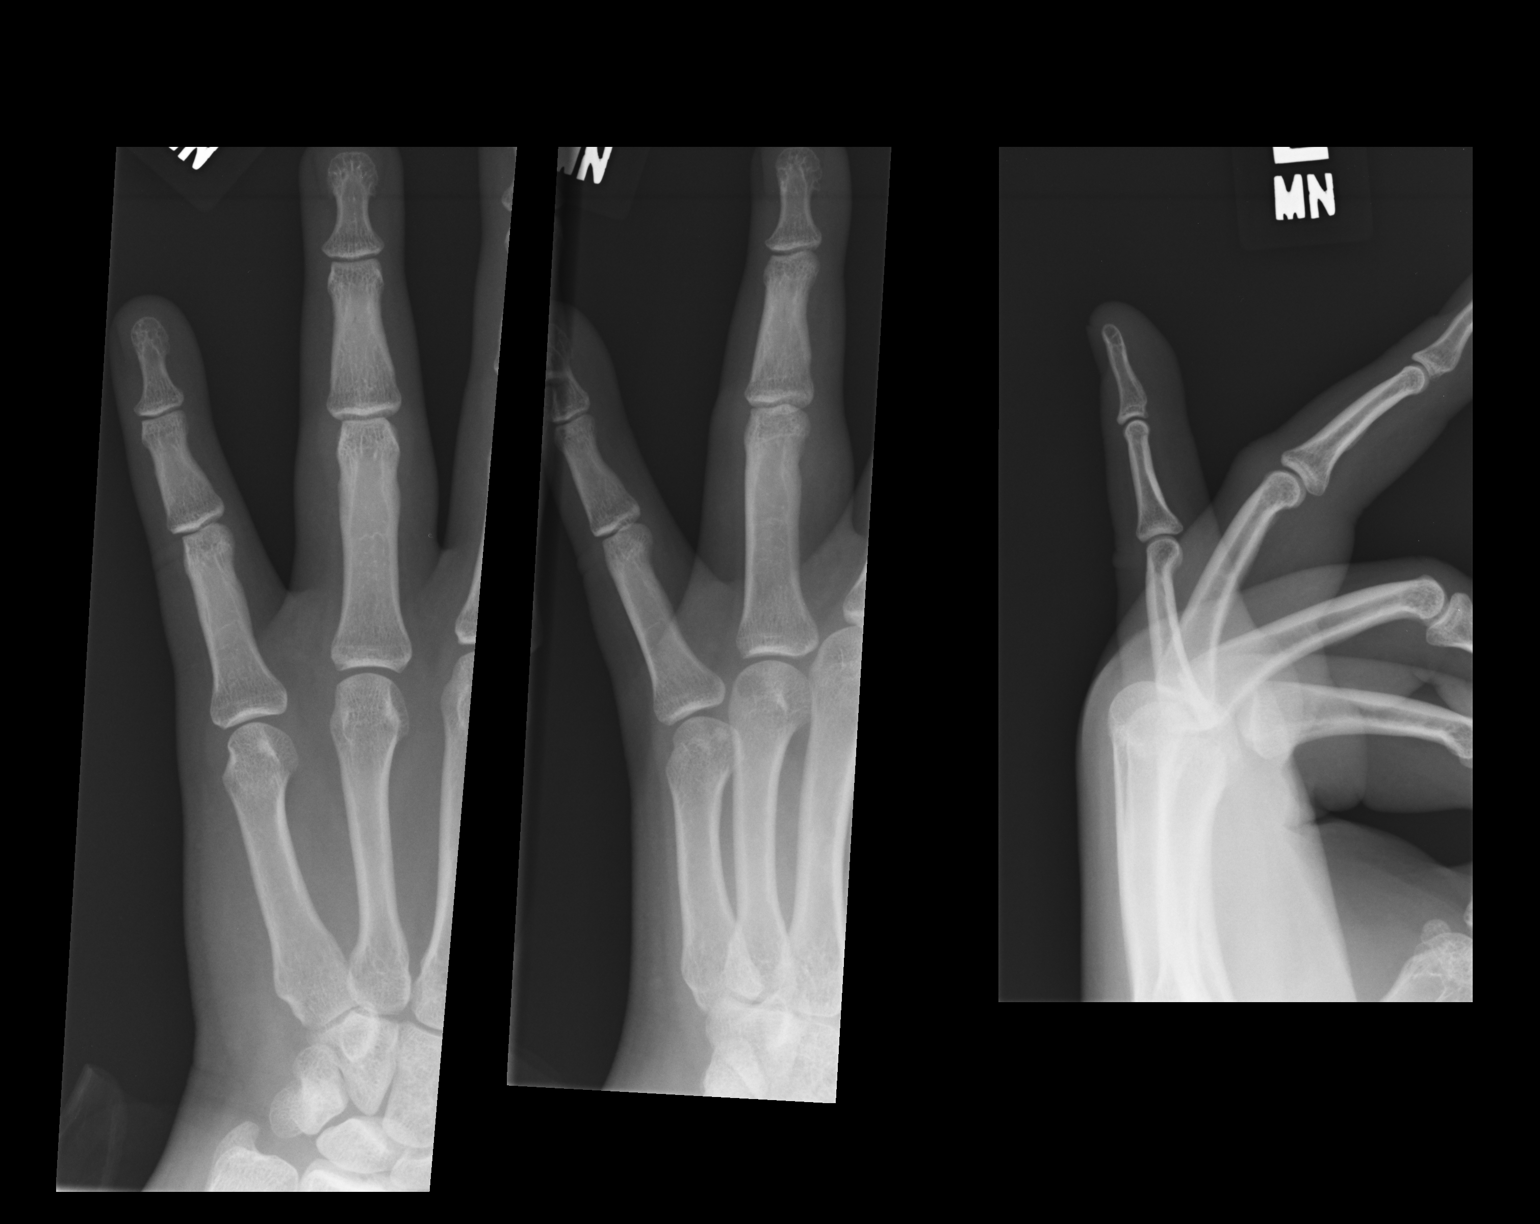

[1 of 1 positions shown; findings below may reference images not displayed]

FINDINGS: Multiple views of the left fourth finger demonstrate no acute
displaced fracture, subluxation, dislocation, or soft tissue
abnormality.
IMPRESSION: No acute radiographic abnormality of the left fourth finger.

## 2016-11-03 NOTE — Progress Notes (Deleted)
   Subjective:    Patient ID: Daniel Massey, male    DOB: [birth year], 26 y.o.   MRN: [id number]  HPI Pt returns for f/u of diabetes mellitus:  DM type: Insulin-requiring type 2.   Dx'ed: 2011 Complications: none Therapy: insulin since 2012 DKA: never Severe hypoglycemia: never. Pancreatitis: never. Other: he takes QD insulin, after poor results with multiple daily injections; he works at [employer], 3rd shift.  Interval history: he takes lantus, 120 units daily.  no cbg record, but states cbg's are seldom low.  He says this happens when he misses a meal.  He says he never misses the humalog, approx 2-3 doses per week, and the lantus approx 1 dose per week.     Review of Systems     Objective:   Physical Exam VITAL SIGNS:  See vs page GENERAL: no distress Pulses: dorsalis pedis intact bilat.   MSK: no deformity of the feet CV: no leg edema Skin:  no ulcer on the feet.  normal color and temp on the feet. Neuro: sensation is intact to touch on the feet      Assessment & Plan:  Insulin-requiring type 2 DM: ongoing poor control.   Noncompliance with cbg recording and insulin injections, ongoing.

## 2016-11-08 ENCOUNTER — Ambulatory Visit: Payer: 59 | Admitting: Endocrinology

## 2017-01-24 ENCOUNTER — Ambulatory Visit (INDEPENDENT_AMBULATORY_CARE_PROVIDER_SITE_OTHER): Payer: 59 | Admitting: Endocrinology

## 2017-01-24 ENCOUNTER — Encounter: Payer: Self-pay | Admitting: Endocrinology

## 2017-01-24 VITALS — BP 134/88 | HR 90 | Wt 276.0 lb

## 2017-01-24 DIAGNOSIS — E1165 Type 2 diabetes mellitus with hyperglycemia: Secondary | ICD-10-CM

## 2017-01-24 DIAGNOSIS — Z794 Long term (current) use of insulin: Secondary | ICD-10-CM | POA: Diagnosis not present

## 2017-01-24 DIAGNOSIS — IMO0001 Reserved for inherently not codable concepts without codable children: Secondary | ICD-10-CM

## 2017-01-24 LAB — POCT GLYCOSYLATED HEMOGLOBIN (HGB A1C): HEMOGLOBIN A1C: 7.9

## 2017-01-24 MED ORDER — INSULIN GLARGINE 100 UNIT/ML SOLOSTAR PEN: 80.0000 [IU] | PEN_INJECTOR | Freq: Every day | SUBCUTANEOUS | 99 refills | Status: DC

## 2017-01-24 NOTE — Progress Notes (Signed)
Subjective:    Patient ID: Daniel Massey, male    DOB: April 16, 1991, 26 y.o.   MRN: [id number]  HPI Pt returns for f/u of diabetes mellitus:  DM type: Insulin-requiring type 2.   Dx'ed: 2011 Complications: none Therapy: insulin since 2012 DKA: never Severe hypoglycemia: once, in Jan, 2018 Pancreatitis: never. Other: he takes QD insulin, after poor results with multiple daily injections; he works at [employer], 3rd shift.  Interval history: He says he misses the humalog approx once per day.  He does not miss the lantus.  He seldom has hypoglycemia, and these episodes are mild.  pt states he feels well in general.  no cbg record, but states hypoglycemia only happens at work.  He only takes 70/day, of lantus.   Past Medical History:  Diagnosis Date  . Abscess of left hand 09/12/2015  . Diabetes mellitus without complication (HCC)   . Hypertension     Past Surgical History:  Procedure Laterality Date  . I&D EXTREMITY Left 09/12/2015   Procedure: IRRIGATION AND DEBRIDEMENT LEFT RING FINGER ABSCESS;  Surgeon: Dominica Severin, MD;  Location: [facility] OR;  Service: Orthopedics;  Laterality: Left;  . ORIF ULNAR FRACTURE Right 08/2008    Social History   Social History  . Marital status: Single    Spouse name: girlfriend-Daniel Massey  . Number of children: 0  . Years of education: college   Occupational History  . MLT     [employer]   Social History Main Topics  . Smoking status: Never Smoker  . Smokeless tobacco: Never Used  . Alcohol use 1.2 oz/week    2 Standard drinks or equivalent per week  . Drug use: No  . Sexual activity: Yes    Partners: Female   Other Topics Concern  . Not on file   Social History Narrative   Graduate of [employer] with a degree in Biology.   Lives with his girlfriend, Daniel Massey.   Family lives in [place], [place].    Current Outpatient Prescriptions on File Prior to Visit  Medication Sig Dispense Refill  . glucose blood (ONETOUCH VERIO) test strip 1 each by Other  route 2 (two) times daily. And lancets 2/day 100 each 12  . insulin lispro (HUMALOG KWIKPEN) 100 UNIT/ML KiwkPen Inject 0.2 mLs (20 Units total) into the skin 3 (three) times daily with meals. And pen needles 4/day 60 mL 11   No current facility-administered medications on file prior to visit.     Allergies  Allergen Reactions  . Penicillins     Just didn't work Has patient had a PCN reaction causing immediate rash, facial/tongue/throat swelling, SOB or lightheadedness with hypotension: unknown Has patient had a PCN reaction causing severe rash involving mucus membranes or skin necrosis: unknown Has patient had a PCN reaction that required hospitalization: Unknown Has patient had a PCN reaction occurring within the last 10 years: Unknown If all of the above answers are "NO", then may proceed with Cephalosporin use.     Family History  Problem Relation Age of Onset  . Diabetes Mother   . Hypertension Mother   . Cancer Father 96    colon  . Hypertension Father     BP 134/88 (BP Location: Left Arm, Patient Position: Sitting)   Pulse 90   Wt 276 lb (125.2 kg)   SpO2 97%   BMI 36.41 kg/m   Review of Systems He denies hypoglycemia.      Objective:   Physical Exam VITAL SIGNS:  See vs page GENERAL:  no distress Pulses: dorsalis pedis intact bilat.   MSK: no deformity of the feet CV: no leg edema Skin:  no ulcer on the feet.  normal color and temp on the feet. Neuro: sensation is intact to touch on the feet.   a1c=7.9%    Assessment & Plan:  Insulin-requiring type 2 DM: The pattern of his cbg's indicates he needs some adjustment in his therapy.  Severe hypoglycemia. This limits the rx of DM.   Occupational status: he needs to adjust insulin for this.   Patient Instructions  check your blood sugar twice a day.  vary the time of day when you check, between before the 3 meals, and at bedtime.  also check if you have symptoms of your blood sugar being too high or too low.   please keep a record of the readings and bring it to your next appointment here.  You can write it on any piece of paper.  please call us sooner if your blood sugar goes below 70, or if you have a lot of readings over 200.  On this type of insulin schedule, you should eat meals on a regular schedule.  If a meal is missed or significantly delayed, your blood sugar could go low.   Please increase the lantus to 80 units per day on days off, and: Take 60 units if you are going to work.   Take humalog, 10-20 units, with as many meals as you can.   Please come back for a follow-up appointment in 3 months.

## 2017-01-24 NOTE — Patient Instructions (Signed)
check your blood sugar twice a day.  vary the time of day when you check, between before the 3 meals, and at bedtime.  also check if you have symptoms of your blood sugar being too high or too low.  please keep a record of the readings and bring it to your next appointment here.  You can write it on any piece of paper.  please call us sooner if your blood sugar goes below 70, or if you have a lot of readings over 200.  On this type of insulin schedule, you should eat meals on a regular schedule.  If a meal is missed or significantly delayed, your blood sugar could go low.   Please increase the lantus to 80 units per day on days off, and: Take 60 units if you are going to work.   Take humalog, 10-20 units, with as many meals as you can.   Please come back for a follow-up appointment in 3 months.

## 2017-03-03 ENCOUNTER — Other Ambulatory Visit: Payer: Self-pay | Admitting: Endocrinology

## 2017-03-04 ENCOUNTER — Other Ambulatory Visit: Payer: Self-pay

## 2017-03-04 MED ORDER — INSULIN GLARGINE 100 UNIT/ML SOLOSTAR PEN: 80.0000 [IU] | PEN_INJECTOR | Freq: Every day | SUBCUTANEOUS | 4 refills | Status: DC

## 2017-03-11 ENCOUNTER — Other Ambulatory Visit: Payer: Self-pay

## 2017-03-11 MED ORDER — INSULIN GLARGINE 100 UNIT/ML SOLOSTAR PEN: 80.0000 [IU] | PEN_INJECTOR | Freq: Every day | SUBCUTANEOUS | 2 refills | Status: DC

## 2017-03-13 ENCOUNTER — Telehealth: Payer: Self-pay | Admitting: Endocrinology

## 2017-03-13 NOTE — Telephone Encounter (Signed)
Reference #: [id number]  Pharmacy called and need to speak to nursing staff about fax received at their office. They have questions about Insulin Glargine (LANTUS SOLOSTAR) 100 UNIT/ML Solostar Pen  Please call to advise.

## 2017-03-14 NOTE — Telephone Encounter (Signed)
[facility] called to check the status of the note below, they stated this was the final attempt. Please advise.

## 2017-03-25 NOTE — Telephone Encounter (Signed)
Spoke with the pharmacy and they needed clarification on the Lantus- they will be contacting the patient to get more details and call me back when they decide what needs to be done

## 2017-05-15 ENCOUNTER — Other Ambulatory Visit: Payer: Self-pay

## 2017-05-15 MED ORDER — INSULIN GLARGINE 100 UNIT/ML SOLOSTAR PEN: 80.0000 [IU] | PEN_INJECTOR | Freq: Every day | SUBCUTANEOUS | 2 refills | Status: DC

## 2017-05-16 ENCOUNTER — Telehealth: Payer: Self-pay | Admitting: Endocrinology

## 2017-05-16 NOTE — Telephone Encounter (Signed)
Patient called in reference to not receiving  Insulin Glargine (LANTUS SOLOSTAR) 100 UNIT/ML Solostar Pen yet. Please call patient and advise. OK to leave message.   [facility] [place] - [id number] [phone] (Phone) [phone] (Fax)

## 2017-05-16 NOTE — Telephone Encounter (Signed)
Called and spoke with patient. Informed him that I sent in prescription yesterday 8/2 to [facility].

## 2017-05-21 ENCOUNTER — Other Ambulatory Visit: Payer: Self-pay

## 2017-05-21 MED ORDER — INSULIN GLARGINE 100 UNIT/ML SOLOSTAR PEN: 80.0000 [IU] | PEN_INJECTOR | Freq: Every day | SUBCUTANEOUS | 2 refills | Status: DC

## 2017-05-22 ENCOUNTER — Telehealth: Payer: Self-pay | Admitting: Endocrinology

## 2017-05-22 NOTE — Telephone Encounter (Signed)
Clarification on lantus [id number].  Ty,  -LL

## 2017-05-22 NOTE — Telephone Encounter (Signed)
Patient called need clarification on prescription lantus insulin, please advise.

## 2017-05-22 NOTE — Telephone Encounter (Signed)
Called and spoke with patient he said that issue was resolved.

## 2017-05-23 NOTE — Telephone Encounter (Signed)
Called [facility] and they needed clarification on the amount of insulin taken daily. I stated that Dr. Everardo AllEllison was out of town & I would call back to clarify when he returned.

## 2017-05-23 NOTE — Telephone Encounter (Signed)
Routing to you °

## 2017-05-27 NOTE — Telephone Encounter (Signed)
Called [facility] and clarified prescription.

## 2017-06-03 ENCOUNTER — Ambulatory Visit: Payer: 59 | Admitting: Endocrinology

## 2017-09-17 ENCOUNTER — Other Ambulatory Visit: Payer: Self-pay | Admitting: Endocrinology

## 2018-05-11 ENCOUNTER — Telehealth: Payer: Self-pay

## 2018-05-11 NOTE — Telephone Encounter (Signed)
Yes, please refer to PCP 

## 2018-05-11 NOTE — Telephone Encounter (Signed)
Patient is requesting an order be placed for varicella and MMR titer- he wants to go to [facility] in [place] to have it drawn please contact patient when this has been done-

## 2018-05-11 NOTE — Telephone Encounter (Signed)
Shouldn't I refer this to PCP?

## 2018-05-12 NOTE — Telephone Encounter (Signed)
I reached patient & explained that he needed to refer to PCP.

## 2018-07-01 ENCOUNTER — Other Ambulatory Visit: Payer: Self-pay | Admitting: Endocrinology

## 2018-07-01 NOTE — Telephone Encounter (Signed)
Any refills will be approved at appt/must schedule first/advised to F/U in July/thx dmf

## 2018-10-27 ENCOUNTER — Ambulatory Visit: Payer: 59 | Admitting: Endocrinology

## 2018-10-27 ENCOUNTER — Encounter: Payer: Self-pay | Admitting: Endocrinology

## 2018-10-27 VITALS — BP 128/82 | HR 74 | Ht 73.0 in | Wt 269.0 lb

## 2018-10-27 DIAGNOSIS — Z794 Long term (current) use of insulin: Secondary | ICD-10-CM | POA: Diagnosis not present

## 2018-10-27 DIAGNOSIS — E1165 Type 2 diabetes mellitus with hyperglycemia: Secondary | ICD-10-CM | POA: Diagnosis not present

## 2018-10-27 DIAGNOSIS — IMO0001 Reserved for inherently not codable concepts without codable children: Secondary | ICD-10-CM

## 2018-10-27 LAB — POCT GLYCOSYLATED HEMOGLOBIN (HGB A1C): Hemoglobin A1C: 8 % — AB (ref 4.0–5.6)

## 2018-10-27 MED ORDER — INSULIN PEN NEEDLE 32G X 4 MM MISC: 1.0000 | Freq: Three times a day (TID) | 3 refills | Status: DC

## 2018-10-27 MED ORDER — BASAGLAR KWIKPEN 100 UNIT/ML ~~LOC~~ SOPN: 70.0000 [IU] | PEN_INJECTOR | SUBCUTANEOUS | 3 refills | Status: DC

## 2018-10-27 NOTE — Patient Instructions (Addendum)
check your blood sugar twice a day.  vary the time of day when you check, between before the 3 meals, and at bedtime.  also check if you have symptoms of your blood sugar being too high or too low.  please keep a record of the readings and bring it to your next appointment here.  You can write it on any piece of paper.  please call us sooner if your blood sugar goes below 70, or if you have a lot of readings over 200.  On this type of insulin schedule, you should eat meals on a regular schedule.  If a meal is missed or significantly delayed, your blood sugar could go low.   Please change the lantus to basaglar, 70 units per day.  Our goal is to minimize the humalog.  Please come back for a follow-up appointment in 2 months.

## 2018-10-27 NOTE — Progress Notes (Signed)
Subjective:    Patient ID: Daniel Massey, male    DOB: 16-Feb-1991, 28 y.o.   MRN: [id number]  HPI Pt returns for f/u of diabetes mellitus:  DM type: Insulin-requiring type 2.   Dx'ed: 2011 Complications: none Therapy: insulin since 2012 DKA: never Severe hypoglycemia: once, in Jan, 2018 Pancreatitis: never. Other: he takes QD insulin, after poor results with multiple daily injections; he works at [employer], but now only part time.  Interval history: pt states he feels well in general.  He brings a record of his cbg's which I have reviewed today.  cbg varies from 44-386. There is no trend throughout the day.  He only takes 50/day, of lantus, and PRN humalog (averages approx 20-30/units day).  He says cbg's are now similar on work days vs days off.  Past Medical History:  Diagnosis Date  . Abscess of left hand 09/12/2015  . Diabetes mellitus without complication (HCC)   . Hypertension     Past Surgical History:  Procedure Laterality Date  . I&D EXTREMITY Left 09/12/2015   Procedure: IRRIGATION AND DEBRIDEMENT LEFT RING FINGER ABSCESS;  Surgeon: Dominica Severin, MD;  Location: [facility] OR;  Service: Orthopedics;  Laterality: Left;  . ORIF ULNAR FRACTURE Right 08/2008    Social History   Socioeconomic History  . Marital status: Single    Spouse name: [name]  . Number of children: 0  . Years of education: college  . Highest education level: Not on file  Occupational History  . Occupation: MLT    Comment: [employer]  Social Needs  . Financial resource strain: Not on file  . Food insecurity:    Worry: Not on file    Inability: Not on file  . Transportation needs:    Medical: Not on file    Non-medical: Not on file  Tobacco Use  . Smoking status: Never Smoker  . Smokeless tobacco: Never Used  Substance and Sexual Activity  . Alcohol use: Yes    Alcohol/week: 2.0 standard drinks    Types: 2 Standard drinks or equivalent per week  . Drug use: No  . Sexual activity:  Yes    Partners: Female  Lifestyle  . Physical activity:    Days per week: Not on file    Minutes per session: Not on file  . Stress: Not on file  Relationships  . Social connections:    Talks on phone: Not on file    Gets together: Not on file    Attends religious service: Not on file    Active member of club or organization: Not on file    Attends meetings of clubs or organizations: Not on file    Relationship status: Not on file  . Intimate partner violence:    Fear of current or ex partner: Not on file    Emotionally abused: Not on file    Physically abused: Not on file    Forced sexual activity: Not on file  Other Topics Concern  . Not on file  Social History Narrative   Graduate of [employer] with a degree in Biology.   Lives with his girlfriend, Lorane Gell.   Family lives in [place], [place].    Current Outpatient Medications on File Prior to Visit  Medication Sig Dispense Refill  . glucose blood (ONETOUCH VERIO) test strip 1 each by Other route 2 (two) times daily. And lancets 2/day 100 each 12  . HUMALOG KWIKPEN 100 UNIT/ML KiwkPen INJECT 20 UNITS UNDER THE  SKIN 3 TIMES DAILY  WITH  MEALS 60 mL 11   No current facility-administered medications on file prior to visit.     Allergies  Allergen Reactions  . Penicillins     Just didn't work Has patient had a PCN reaction causing immediate rash, facial/tongue/throat swelling, SOB or lightheadedness with hypotension: unknown Has patient had a PCN reaction causing severe rash involving mucus membranes or skin necrosis: unknown Has patient had a PCN reaction that required hospitalization: Unknown Has patient had a PCN reaction occurring within the last 10 years: Unknown If all of the above answers are "NO", then may proceed with Cephalosporin use.     Family History  Problem Relation Age of Onset  . Diabetes Mother   . Hypertension Mother   . Cancer Father 76       colon  . Hypertension Father     BP 128/82 (BP Location:  Left Arm, Patient Position: Sitting, Cuff Size: Large)   Pulse 74   Ht 6\' 1"  (1.854 m)   Wt 269 lb (122 kg)   SpO2 97%   BMI 35.49 kg/m    Review of Systems Denies LOC.      Objective:   Physical Exam VITAL SIGNS:  See vs page GENERAL: no distress Pulses: dorsalis pedis intact bilat.   MSK: no deformity of the feet.  CV: no leg edema.  Skin:  no ulcer on the feet.  normal color and temp on the feet.   Neuro: sensation is intact to touch on the feet.    A1c=8.0%     Assessment & Plan:  Insulin-requiring type 2 DM, with hypoglycemia: worse Noncompliance with insulin and f/u ov's:    Patient Instructions  check your blood sugar twice a day.  vary the time of day when you check, between before the 3 meals, and at bedtime.  also check if you have symptoms of your blood sugar being too high or too low.  please keep a record of the readings and bring it to your next appointment here.  You can write it on any piece of paper.  please call us sooner if your blood sugar goes below 70, or if you have a lot of readings over 200.  On this type of insulin schedule, you should eat meals on a regular schedule.  If a meal is missed or significantly delayed, your blood sugar could go low.   Please change the lantus to basaglar, 70 units per day.  Our goal is to minimize the humalog.  Please come back for a follow-up appointment in 2 months.

## 2018-12-17 ENCOUNTER — Ambulatory Visit: Payer: 59 | Admitting: Endocrinology

## 2018-12-17 ENCOUNTER — Telehealth: Payer: Self-pay | Admitting: Endocrinology

## 2018-12-17 NOTE — Telephone Encounter (Signed)
Please schedule f/u appt for next available appointment  

## 2018-12-17 NOTE — Telephone Encounter (Signed)
Patient no showed today's appt. Please advise on how to follow up. °A. No follow up necessary. °B. Follow up urgent. Contact patient immediately. °C. Follow up necessary. Contact patient and schedule visit in ___ days. °D. Follow up advised. Contact patient and schedule visit in ____weeks. ° °Would you like the NS fee to be applied to this visit? ° °

## 2018-12-17 NOTE — Telephone Encounter (Signed)
Please refer to Dr. Ellison's response 

## 2018-12-17 NOTE — Telephone Encounter (Signed)
LMTCB to reschedule missed appointment °

## 2019-01-01 ENCOUNTER — Telehealth: Payer: Self-pay | Admitting: Endocrinology

## 2019-01-01 ENCOUNTER — Other Ambulatory Visit: Payer: Self-pay

## 2019-01-01 DIAGNOSIS — IMO0001 Reserved for inherently not codable concepts without codable children: Secondary | ICD-10-CM

## 2019-01-01 DIAGNOSIS — E1165 Type 2 diabetes mellitus with hyperglycemia: Principal | ICD-10-CM

## 2019-01-01 DIAGNOSIS — Z794 Long term (current) use of insulin: Principal | ICD-10-CM

## 2019-01-01 MED ORDER — BASAGLAR KWIKPEN 100 UNIT/ML ~~LOC~~ SOPN: 70.0000 [IU] | PEN_INJECTOR | SUBCUTANEOUS | 3 refills | Status: DC

## 2019-01-01 NOTE — Telephone Encounter (Signed)
MEDICATION: Insulin Glargine (BASAGLAR KWIKPEN) 100 UNIT/ML SOPN  PHARMACY:  [facility] - [place], [place] - [id number]  IS THIS A 90 DAY SUPPLY : yes  IS PATIENT OUT OF MEDICATION:  no  IF NOT; HOW MUCH IS LEFT: 2 boxes  LAST APPOINTMENT DATE: @3 /02/2019  NEXT APPOINTMENT DATE:@Visit  date not found  DO WE HAVE YOUR PERMISSION TO LEAVE A DETAILED MESSAGE:  OTHER COMMENTS:  Patient wanted a refill sent before things get worse with the COVID 19  **Let patient know to contact pharmacy at the end of the day to make sure medication is ready. **  ** Please notify patient to allow 48-72 hours to process**  **Encourage patient to contact the pharmacy for refills or they can request refills through [facility]**

## 2019-01-01 NOTE — Telephone Encounter (Signed)
Insulin Glargine (BASAGLAR KWIKPEN) 100 UNIT/ML SOPN 25 pen 3 01/01/2019    Sig - Route: Inject 0.7 mLs (70 Units total) into the skin every morning. - Subcutaneous   Sent to pharmacy as: Insulin Glargine (BASAGLAR KWIKPEN) 100 UNIT/ML Solution Pen-injector   E-Prescribing Status: Receipt confirmed by pharmacy (01/01/2019 11:50 AM EDT)

## 2019-03-16 ENCOUNTER — Ambulatory Visit: Payer: 59 | Admitting: Endocrinology

## 2019-03-16 ENCOUNTER — Other Ambulatory Visit: Payer: Self-pay

## 2019-03-16 ENCOUNTER — Encounter: Payer: Self-pay | Admitting: Endocrinology

## 2019-03-16 VITALS — BP 124/88 | HR 83 | Temp 98.4°F | Wt 292.8 lb

## 2019-03-16 DIAGNOSIS — Z794 Long term (current) use of insulin: Secondary | ICD-10-CM

## 2019-03-16 DIAGNOSIS — IMO0001 Reserved for inherently not codable concepts without codable children: Secondary | ICD-10-CM

## 2019-03-16 DIAGNOSIS — E1165 Type 2 diabetes mellitus with hyperglycemia: Secondary | ICD-10-CM

## 2019-03-16 LAB — POCT GLYCOSYLATED HEMOGLOBIN (HGB A1C): Hemoglobin A1C: 8.1 % — AB (ref 4.0–5.6)

## 2019-03-16 MED ORDER — BASAGLAR KWIKPEN 100 UNIT/ML ~~LOC~~ SOPN: 80.0000 [IU] | PEN_INJECTOR | SUBCUTANEOUS | 3 refills | Status: DC

## 2019-03-16 NOTE — Patient Instructions (Addendum)
check your blood sugar twice a day.  vary the time of day when you check, between before the 3 meals, and at bedtime.  also check if you have symptoms of your blood sugar being too high or too low.  please keep a record of the readings and bring it to your next appointment here.  You can write it on any piece of paper.  please call us sooner if your blood sugar goes below 70, or if you have a lot of readings over 200.  On this type of insulin schedule, you should eat meals on a regular schedule.  If a meal is missed or significantly delayed, your blood sugar could go low.   Please increase the basaglar to 80 units per day.   Our goal is to minimize the humalog.  Please come back for a follow-up appointment in 2 months.

## 2019-03-16 NOTE — Progress Notes (Signed)
Subjective:    Patient ID: Daniel Massey, male    DOB: Mar 03, 1991, 28 y.o.   MRN: [id number]  HPI Pt returns for f/u of diabetes mellitus:  DM type: Insulin-requiring type 2.   Dx'ed: 2011 Complications: none Therapy: insulin since 2012 DKA: never Severe hypoglycemia: once, in Jan, 2018.  Pancreatitis: never. Other: he takes QD insulin, after poor results with multiple daily injections; he works at [employer], but now part time.   Interval history: pt states he feels well in general.  no cbg record, but states cbg's are in the mid-100's fasting.  There is no trend throughout the day.  He takes 70/day, of basaglar (which he says he never misses), and PRN humalog (which he sometimes misses--he says he averages approx 30-40/units day).   Past Medical History:  Diagnosis Date  . Abscess of left hand 09/12/2015  . Diabetes mellitus without complication (HCC)   . Hypertension     Past Surgical History:  Procedure Laterality Date  . I&D EXTREMITY Left 09/12/2015   Procedure: IRRIGATION AND DEBRIDEMENT LEFT RING FINGER ABSCESS;  Surgeon: Dominica SeverinWilliam Gramig, MD;  Location: [facility] OR;  Service: Orthopedics;  Laterality: Left;  . ORIF ULNAR FRACTURE Right 08/2008    Social History   Socioeconomic History  . Marital status: Single    Spouse name: [name]  . Number of children: 0  . Years of education: college  . Highest education level: Not on file  Occupational History  . Occupation: MLT    Comment: [employer]  Social Needs  . Financial resource strain: Not on file  . Food insecurity:    Worry: Not on file    Inability: Not on file  . Transportation needs:    Medical: Not on file    Non-medical: Not on file  Tobacco Use  . Smoking status: Never Smoker  . Smokeless tobacco: Never Used  Substance and Sexual Activity  . Alcohol use: Yes    Alcohol/week: 2.0 standard drinks    Types: 2 Standard drinks or equivalent per week  . Drug use: No  . Sexual activity: Yes   Partners: Female  Lifestyle  . Physical activity:    Days per week: Not on file    Minutes per session: Not on file  . Stress: Not on file  Relationships  . Social connections:    Talks on phone: Not on file    Gets together: Not on file    Attends religious service: Not on file    Active member of club or organization: Not on file    Attends meetings of clubs or organizations: Not on file    Relationship status: Not on file  . Intimate partner violence:    Fear of current or ex partner: Not on file    Emotionally abused: Not on file    Physically abused: Not on file    Forced sexual activity: Not on file  Other Topics Concern  . Not on file  Social History Narrative   Graduate of [employer] with a degree in Biology.   Lives with his girlfriend, [name].   Family lives in [place], [place].    Current Outpatient Medications on File Prior to Visit  Medication Sig Dispense Refill  . glucose blood (ONETOUCH VERIO) test strip 1 each by Other route 2 (two) times daily. And lancets 2/day 100 each 12  . HUMALOG KWIKPEN 100 UNIT/ML KiwkPen INJECT 20 UNITS UNDER THE  SKIN 3 TIMES DAILY WITH  MEALS 60 mL 11  .  Insulin Pen Needle (BD PEN NEEDLE NANO U/F) 32G X 4 MM MISC 1 Device by Other route 3 (three) times daily. 270 each 3   No current facility-administered medications on file prior to visit.     Allergies  Allergen Reactions  . Penicillins     Just didn't work Has patient had a PCN reaction causing immediate rash, facial/tongue/throat swelling, SOB or lightheadedness with hypotension: unknown Has patient had a PCN reaction causing severe rash involving mucus membranes or skin necrosis: unknown Has patient had a PCN reaction that required hospitalization: Unknown Has patient had a PCN reaction occurring within the last 10 years: Unknown If all of the above answers are "NO", then may proceed with Cephalosporin use.     Family History  Problem Relation Age of Onset  . Diabetes Mother    . Hypertension Mother   . Cancer Father 12       colon  . Hypertension Father     BP 124/88 (BP Location: Left Arm, Patient Position: Sitting, Cuff Size: Large)   Pulse 83   Temp 98.4 F (36.9 C) (Oral)   Wt 292 lb 12.8 oz (132.8 kg)   SpO2 98%   BMI 38.63 kg/m    Review of Systems He denies hypoglycemia    Objective:   Physical Exam VITAL SIGNS:  See vs page GENERAL: no distress Pulses: dorsalis pedis intact bilat.   MSK: no deformity of the feet CV: no leg edema Skin:  no ulcer on the feet.  normal color and temp on the feet. Neuro: sensation is intact to touch on the feet  Lab Results  Component Value Date   HGBA1C 8.1 (A) 03/16/2019        Assessment & Plan:  Insulin-requiring type 2 DM: he needs increased rx.   Patient Instructions  check your blood sugar twice a day.  vary the time of day when you check, between before the 3 meals, and at bedtime.  also check if you have symptoms of your blood sugar being too high or too low.  please keep a record of the readings and bring it to your next appointment here.  You can write it on any piece of paper.  please call us sooner if your blood sugar goes below 70, or if you have a lot of readings over 200.  On this type of insulin schedule, you should eat meals on a regular schedule.  If a meal is missed or significantly delayed, your blood sugar could go low.   Please increase the basaglar to 80 units per day.   Our goal is to minimize the humalog.  Please come back for a follow-up appointment in 2 months.

## 2019-05-23 ENCOUNTER — Other Ambulatory Visit: Payer: Self-pay | Admitting: Endocrinology

## 2019-05-26 ENCOUNTER — Other Ambulatory Visit: Payer: Self-pay | Admitting: Endocrinology

## 2019-05-26 ENCOUNTER — Telehealth: Payer: Self-pay | Admitting: Endocrinology

## 2019-05-26 MED ORDER — LANTUS SOLOSTAR 100 UNIT/ML ~~LOC~~ SOPN: 80.0000 [IU] | PEN_INJECTOR | SUBCUTANEOUS | 0 refills | Status: DC

## 2019-05-26 NOTE — Telephone Encounter (Signed)
LVM requesting returned call 

## 2019-05-26 NOTE — Telephone Encounter (Signed)
I am not seeing this medication on his list. Please advise if this is medication pt SHOULD be taking but is somehow missing from his list.

## 2019-05-26 NOTE — Addendum Note (Signed)
Addended by: Renato Shin on: 05/26/2019 04:58 PM   Modules accepted: Orders

## 2019-05-26 NOTE — Telephone Encounter (Signed)
Ok, I have sent a prescription to your pharmacy, for lantus pending appt

## 2019-05-26 NOTE — Telephone Encounter (Signed)
This was changed to basaglar

## 2019-05-26 NOTE — Telephone Encounter (Signed)
MEDICATION: Lantus  PHARMACY:  [place], [place] - [address] 90 DAY SUPPLY : YES  IS PATIENT OUT OF MEDICATION:   IF NOT; HOW MUCH IS LEFT: 1 box  LAST APPOINTMENT DATE: @8 /09/2019  NEXT APPOINTMENT DATE:@8 /21/2020  DO WE HAVE YOUR PERMISSION TO LEAVE A DETAILED MESSAGE:  OTHER COMMENTS:    **Let patient know to contact pharmacy at the end of the day to make sure medication is ready. **  ** Please notify patient to allow 48-72 hours to process**  **Encourage patient to contact the pharmacy for refills or they can request refills through [facility]**

## 2019-05-26 NOTE — Telephone Encounter (Signed)
Pt returned call. Inquired further about his request. States he is no longer able to afford Basaglar, cost is $200.00 per refill. He is requesting Lantus because he was on it in the past and feels this will be more affordable. Please advise

## 2019-06-01 ENCOUNTER — Other Ambulatory Visit: Payer: Self-pay

## 2019-06-01 DIAGNOSIS — IMO0001 Reserved for inherently not codable concepts without codable children: Secondary | ICD-10-CM

## 2019-06-01 MED ORDER — BD PEN NEEDLE NANO U/F 32G X 4 MM MISC: 1.0000 | Freq: Three times a day (TID) | 3 refills | Status: DC

## 2019-06-01 MED ORDER — LANTUS SOLOSTAR 100 UNIT/ML ~~LOC~~ SOPN: 80.0000 [IU] | PEN_INJECTOR | SUBCUTANEOUS | 0 refills | Status: DC

## 2019-06-04 ENCOUNTER — Ambulatory Visit: Payer: 59 | Admitting: Endocrinology

## 2019-06-16 ENCOUNTER — Other Ambulatory Visit: Payer: Self-pay | Admitting: Endocrinology

## 2019-06-16 DIAGNOSIS — IMO0001 Reserved for inherently not codable concepts without codable children: Secondary | ICD-10-CM

## 2019-07-23 ENCOUNTER — Other Ambulatory Visit: Payer: Self-pay

## 2019-07-23 ENCOUNTER — Ambulatory Visit
Admission: EM | Admit: 2019-07-23 | Discharge: 2019-07-23 | Disposition: A | Discharge status: Home / Self Care | Payer: 59 | Attending: Urgent Care | Admitting: Urgent Care

## 2019-07-23 DIAGNOSIS — R1032 Left lower quadrant pain: Secondary | ICD-10-CM | POA: Diagnosis not present

## 2019-07-23 LAB — URINALYSIS, COMPLETE (UACMP) WITH MICROSCOPIC
Bilirubin Urine: NEGATIVE
Glucose, UA: NEGATIVE mg/dL
Hgb urine dipstick: NEGATIVE
Ketones, ur: NEGATIVE mg/dL
Leukocytes,Ua: NEGATIVE
Nitrite: NEGATIVE
Protein, ur: NEGATIVE mg/dL
RBC / HPF: NONE SEEN RBC/hpf (ref 0–5)
Specific Gravity, Urine: 1.025 (ref 1.005–1.030)
pH: 5.5 (ref 5.0–8.0)

## 2019-07-23 NOTE — Discharge Instructions (Signed)
It was very nice seeing you today in clinic. Thank you for entrusting me with your care.   Urine was negative. Pain only when you move makes me think it is more musculoskeletal. Monitor symptoms, if worsens, follow up with your PCP.   Make arrangements to follow up with your regular doctor in 1 week for re-evaluation if not improving. If your symptoms/condition worsens, please seek follow up care either here or in the ER. Please remember, our [facility] providers are "right here with you" when you need [place].   Again, it was my pleasure to take care of you today. Thank you for choosing our clinic. I hope that you start to feel better quickly.   Honor Loh, MSN, APRN, FNP-C, CEN Advanced Practice Provider [facility]

## 2019-07-23 NOTE — ED Triage Notes (Signed)
Onset 5 days LLQ denies nausea and diarrhea

## 2019-07-24 NOTE — ED Provider Notes (Signed)
[place], [place]   Name: Daniel Massey DOB: December 09, 1990 MRN: [id number] CSN: [id number] PCP: Patient, No Pcp Per  Arrival date and time:  07/23/19 1829  Chief Complaint:  Abdominal Pain   NOTE: Prior to seeing the patient today, I have reviewed the triage nursing documentation and vital signs. Clinical staff has updated patient's PMH/PSHx, current medication list, and drug allergies/intolerances to ensure comprehensive history available to assist in medical decision making.   History:   HPI: Daniel Massey is a 28 y.o. male who presents today with complaints of pain in the LEFT lower quadrant pain of his abdomen that has been going on since Sunday. Patient denies any associated nausea, vomiting, diarrhea/constipation. His LNBM was earlier today. Patient has not experienced any urinary symptoms; no dysuria, frequency, urgency, gross hematuria, or penile discharge. Pain is described as being sharp in nature. At its worse, pain is only a 2/10, which patient feels is "very mild and minimal" for him. He adds that the pain only really bothers him when he moves; denies pain at rest. Patient does not have any known history of abdominal issues such as IBS or diverticulosis. He has never had any type of abdominal surgeries.  Past Medical History:  Diagnosis Date  . Abscess of left hand 09/12/2015  . Diabetes mellitus without complication (HCC)   . Hypertension     Past Surgical History:  Procedure Laterality Date  . I&D EXTREMITY Left 09/12/2015   Procedure: IRRIGATION AND DEBRIDEMENT LEFT RING FINGER ABSCESS;  Surgeon: Dominica Severin, MD;  Location: [facility] OR;  Service: Orthopedics;  Laterality: Left;  . ORIF ULNAR FRACTURE Right 08/2008    Family History  Problem Relation Age of Onset  . Diabetes Mother   . Hypertension Mother   . Cancer Father 38       colon  . Hypertension Father     Social History   Tobacco Use  . Smoking status: Never Smoker  . Smokeless tobacco: Never Used   Substance Use Topics  . Alcohol use: Yes    Alcohol/week: 2.0 standard drinks    Types: 2 Standard drinks or equivalent per week  . Drug use: No    Patient Active Problem List   Diagnosis Date Noted  . DM (diabetes mellitus), type 2, uncontrolled (HCC) 09/04/2014  . Obesity, Class III, BMI 40-49.9 (morbid obesity) (HCC) 09/04/2014  . Essential hypertension 09/04/2014    Home Medications:    Current Meds  Medication Sig  . glucose blood (ONETOUCH VERIO) test strip 1 each by Other route 2 (two) times daily. And lancets 2/day  . HUMALOG KWIKPEN 100 UNIT/ML KwikPen INJECT 20 UNITS  SUBCUTANEOUSLY 3 TIMES  DAILY WITH MEALS  . Insulin Glargine (LANTUS SOLOSTAR) 100 UNIT/ML Solostar Pen Inject 80 Units into the skin daily. WILL PROVIDE 30 DAY SUPPLY. MUST CALL TO SCHEDULE APPT  . Insulin Pen Needle (BD PEN NEEDLE NANO U/F) 32G X 4 MM MISC 1 Device by Other route 3 (three) times daily.    Allergies:   Penicillins  Review of Systems (ROS): Review of Systems  Constitutional: Negative for chills and fever.  Respiratory: Negative for cough and shortness of breath.   Cardiovascular: Negative for chest pain and palpitations.  Gastrointestinal: Positive for abdominal pain. Negative for blood in stool, diarrhea, nausea and vomiting.  Genitourinary: Negative for discharge, dysuria, flank pain, frequency, hematuria, penile pain, penile swelling, scrotal swelling, testicular pain and urgency.  Musculoskeletal: Negative for back pain.  All other systems reviewed and are negative.  Vital Signs: Today's Vitals   07/23/19 0942 07/23/19 0943 07/23/19 0944 07/23/19 1049  BP:   130/67   Pulse:   93   Resp:   16   Temp:   98.6 F (37 C)   TempSrc:   Oral   SpO2:   99%   Weight:  (!) 305 lb (138.3 kg)    Height:  6' (1.829 m)    PainSc: 2    2     Physical Exam: Physical Exam  Constitutional: He is oriented to person, place, and time and well-developed, well-nourished, and in no  distress. No distress.  HENT:  Head: Normocephalic and atraumatic.  Nose: Nose normal.  Mouth/Throat: Oropharynx is clear and moist.  Eyes: Pupils are equal, round, and reactive to light. Conjunctivae and EOM are normal.  Neck: Normal range of motion. Neck supple. No tracheal deviation present.  Cardiovascular: Normal rate, regular rhythm, normal heart sounds and intact distal pulses. Exam reveals no gallop and no friction rub.  No murmur heard. Pulmonary/Chest: Effort normal and breath sounds normal. No respiratory distress. He has no wheezes. He has no rales.  Abdominal: Soft. Bowel sounds are normal. He exhibits no distension. There is no abdominal tenderness.  Musculoskeletal: Normal range of motion.  Lymphadenopathy:    He has no cervical adenopathy.  Neurological: He is alert and oriented to person, place, and time. Gait normal.  Skin: Skin is warm and dry. No rash noted. He is not diaphoretic.  Psychiatric: Mood, memory, affect and judgment normal.  Nursing note and vitals reviewed.   Urgent Care Treatments / Results:   LABS: PLEASE NOTE: all labs that were ordered this encounter are listed, however only abnormal results are displayed. Labs Reviewed  URINALYSIS, COMPLETE (UACMP) WITH MICROSCOPIC - Abnormal; Notable for the following components:      Result Value   Bacteria, UA RARE (*)    All other components within normal limits    EKG: -None  RADIOLOGY: No results found.  PROCEDURES: Procedures  MEDICATIONS RECEIVED THIS VISIT: Medications - No data to display  PERTINENT CLINICAL COURSE NOTES/UPDATES:   Initial Impression / Assessment and Plan / Urgent Care Course:  Pertinent labs & imaging results that were available during my care of the patient were personally reviewed by me and considered in my medical decision making (see lab/imaging section of note for values and interpretations).  Daniel Massey is a 28 y.o. male who presents to [facility]  today with complaints of Abdominal Pain   Patient is well appearing overall in clinic today. He does not appear to be in any acute distress. Presenting symptoms (see HPI) and exam as documented above. No nausea, vomiting, diarrhea, or urinary symptoms. No hematochezia or melena. Pain seems to be more musculoskeletal related as it is only associated with movement. No fevers. Reassurance provided. Patient encouraged to uses APAP and/or IBU as needed for symptoms. Patient to return call to the clinic if worsening or if he develops other concerning symptoms.   Discussed follow up with primary care physician in 1 week for re-evaluation. I have reviewed the follow up and strict return precautions for any new or worsening symptoms. Patient is aware of symptoms that would be deemed urgent/emergent, and would thus require further evaluation either here or in the emergency department. At the time of discharge, he verbalized understanding and consent with the discharge plan as it was reviewed with him. All questions were fielded by provider and/or clinic staff prior to patient  discharge.    Final Clinical Impressions / Urgent Care Diagnoses:   Final diagnoses:  Left lower quadrant abdominal pain    New Prescriptions:  [place] Controlled Substance Registry consulted? Not Applicable  No orders of the defined types were placed in this encounter.   Recommended Follow up Care:  Patient encouraged to follow up with the following provider within the specified time frame, or sooner as dictated by the severity of his symptoms. As always, he was instructed that for any urgent/emergent care needs, he should seek care either here or in the emergency department for more immediate evaluation.  Follow-up Information    PCP In 1 week.   Why: General reassessment of symptoms if not improving        NOTE: This note was prepared using Scientist, clinical (histocompatibility and immunogenetics)Dragon dictation software along with smaller Lobbyistphrase technology. Despite my best ability  to proofread, there is the potential that transcriptional errors may still occur from this process, and are completely unintentional.     Verlee MonteGray, Shakeira Rhee E, NP 07/24/19 [phone]

## 2019-08-12 ENCOUNTER — Other Ambulatory Visit: Payer: Self-pay | Admitting: Endocrinology

## 2019-08-12 NOTE — Telephone Encounter (Signed)
Patient scheduled for 12/14 10:45

## 2019-08-12 NOTE — Telephone Encounter (Signed)
Please advise 

## 2019-08-12 NOTE — Telephone Encounter (Signed)
Called pt and left detailed voicemail of MD message and currently waiting on call back from pt to schedule appt.

## 2019-08-12 NOTE — Telephone Encounter (Signed)
1.  Please schedule f/u appt 2.  Then please refill x 1, pending that appt.  

## 2019-08-27 ENCOUNTER — Other Ambulatory Visit: Payer: Self-pay | Admitting: Endocrinology

## 2019-09-27 ENCOUNTER — Ambulatory Visit: Payer: 59 | Admitting: Endocrinology

## 2019-09-27 ENCOUNTER — Other Ambulatory Visit: Payer: Self-pay

## 2019-09-27 ENCOUNTER — Encounter: Payer: Self-pay | Admitting: Endocrinology

## 2019-09-27 VITALS — BP 140/98 | HR 87 | Ht 72.0 in | Wt 316.6 lb

## 2019-09-27 DIAGNOSIS — E1165 Type 2 diabetes mellitus with hyperglycemia: Secondary | ICD-10-CM

## 2019-09-27 LAB — POCT GLYCOSYLATED HEMOGLOBIN (HGB A1C): Hemoglobin A1C: 8.7 % — AB (ref 4.0–5.6)

## 2019-09-27 MED ORDER — TOUJEO MAX SOLOSTAR 300 UNIT/ML ~~LOC~~ SOPN: 100.0000 [IU] | PEN_INJECTOR | SUBCUTANEOUS | 3 refills | Status: DC

## 2019-09-27 NOTE — Progress Notes (Signed)
Subjective:    Patient ID: Daniel Massey, male    DOB: 1991/08/01, 28 y.o.   MRN: [id number]  HPI Pt returns for f/u of diabetes mellitus:  DM type: Insulin-requiring type 2.   Dx'ed: 2011 Complications: none Therapy: insulin since 2012 DKA: never Severe hypoglycemia: once, in Jan, 2018.  Pancreatitis: never. Other: he takes QD insulin, after poor results with multiple daily injections; he works at [employer], but now part time.   Interval history: pt states he feels well in general.  no cbg record, but says cbg varies from 50-460.  He takes 80/day, of Lantus (which he says he never misses), and PRN humalog (which he averages approx 40/units day).  He has mild hypoglycemia approx once per week.  This happens at work, when he misses lunch.   Past Medical History:  Diagnosis Date  . Abscess of left hand 09/12/2015  . Diabetes mellitus without complication (HCC)   . Hypertension     Past Surgical History:  Procedure Laterality Date  . I&D EXTREMITY Left 09/12/2015   Procedure: IRRIGATION AND DEBRIDEMENT LEFT RING FINGER ABSCESS;  Surgeon: Dominica Severin, MD;  Location: [facility] OR;  Service: Orthopedics;  Laterality: Left;  . ORIF ULNAR FRACTURE Right 08/2008    Social History   Socioeconomic History  . Marital status: Single    Spouse name: [name]  . Number of children: 0  . Years of education: college  . Highest education level: Not on file  Occupational History  . Occupation: MLT    Comment: [employer]  Tobacco Use  . Smoking status: Never Smoker  . Smokeless tobacco: Never Used  Substance and Sexual Activity  . Alcohol use: Yes    Alcohol/week: 2.0 standard drinks    Types: 2 Standard drinks or equivalent per week  . Drug use: No  . Sexual activity: Yes    Partners: Female  Other Topics Concern  . Not on file  Social History Narrative   Graduate of [employer] with a degree in Biology.   Lives with his girlfriend, Lorane Gell.   Family lives in [place], [place].    Social Determinants of Health   Financial Resource Strain:   . Difficulty of Paying Living Expenses: Not on file  Food Insecurity:   . Worried About Programme researcher, broadcasting/film/video in the Last Year: Not on file  . Ran Out of Food in the Last Year: Not on file  Transportation Needs:   . Lack of Transportation (Medical): Not on file  . Lack of Transportation (Non-Medical): Not on file  Physical Activity:   . Days of Exercise per Week: Not on file  . Minutes of Exercise per Session: Not on file  Stress:   . Feeling of Stress : Not on file  Social Connections:   . Frequency of Communication with Friends and Family: Not on file  . Frequency of Social Gatherings with Friends and Family: Not on file  . Attends Religious Services: Not on file  . Active Member of Clubs or Organizations: Not on file  . Attends Banker Meetings: Not on file  . Marital Status: Not on file  Intimate Partner Violence:   . Fear of Current or Ex-Partner: Not on file  . Emotionally Abused: Not on file  . Physically Abused: Not on file  . Sexually Abused: Not on file    Current Outpatient Medications on File Prior to Visit  Medication Sig Dispense Refill  . glucose blood (ONETOUCH VERIO) test strip 1 each by  Other route 2 (two) times daily. And lancets 2/day 100 each 12   No current facility-administered medications on file prior to visit.    Allergies  Allergen Reactions  . Penicillins     Just didn't work Has patient had a PCN reaction causing immediate rash, facial/tongue/throat swelling, SOB or lightheadedness with hypotension: unknown Has patient had a PCN reaction causing severe rash involving mucus membranes or skin necrosis: unknown Has patient had a PCN reaction that required hospitalization: Unknown Has patient had a PCN reaction occurring within the last 10 years: Unknown If all of the above answers are "NO", then may proceed with Cephalosporin use.     Family History  Problem Relation  Age of Onset  . Diabetes Mother   . Hypertension Mother   . Cancer Father 53       colon  . Hypertension Father     BP (!) 140/98 (BP Location: Right Arm, Patient Position: Sitting, Cuff Size: Large)   Pulse 87   Ht 6' (1.829 m)   Wt (!) 316 lb 9.6 oz (143.6 kg)   SpO2 98%   BMI 42.94 kg/m   Review of Systems Denies LOC    Objective:   Physical Exam  VITAL SIGNS:  See vs page GENERAL: no distress Pulses: dorsalis pedis intact bilat.   MSK: no deformity of the feet CV: no leg edema Skin:  no ulcer on the feet.  normal color and temp on the feet. Neuro: sensation is intact to touch on the feet  Lab Results  Component Value Date   HGBA1C 8.7 (A) 09/27/2019       Assessment & Plan:  Insulin-requiring type 2 DM: worse. A1c is too high for him to benefit from multiple daily injections HTN: is noted today  Patient Instructions  Your blood pressure is high today.  Please see a primary care provider soon, to have it rechecked check your blood sugar twice a day.  vary the time of day when you check, between before the 3 meals, and at bedtime.  also check if you have symptoms of your blood sugar being too high or too low.  please keep a record of the readings and bring it to your next appointment here.  You can write it on any piece of paper.  please call us sooner if your blood sugar goes below 70, or if you have a lot of readings over 200.  On this type of insulin schedule, you should eat meals on a regular schedule.  If a meal is missed or significantly delayed, your blood sugar could go low.   Please increase the Lantus to 100 units per day.  I have prescribed the super concentrated type, which is called "Toujeo."  Our goal is to minimize the humalog.  Please come back for a follow-up appointment in 2 months.

## 2019-09-27 NOTE — Patient Instructions (Addendum)
Your blood pressure is high today.  Please see a primary care provider soon, to have it rechecked check your blood sugar twice a day.  vary the time of day when you check, between before the 3 meals, and at bedtime.  also check if you have symptoms of your blood sugar being too high or too low.  please keep a record of the readings and bring it to your next appointment here.  You can write it on any piece of paper.  please call us sooner if your blood sugar goes below 70, or if you have a lot of readings over 200.  On this type of insulin schedule, you should eat meals on a regular schedule.  If a meal is missed or significantly delayed, your blood sugar could go low.   Please increase the Lantus to 100 units per day.  I have prescribed the super concentrated type, which is called "Toujeo."  Our goal is to minimize the humalog.  Please come back for a follow-up appointment in 2 months.

## 2019-09-28 ENCOUNTER — Telehealth: Payer: Self-pay

## 2019-09-28 ENCOUNTER — Other Ambulatory Visit: Payer: Self-pay

## 2019-09-28 DIAGNOSIS — E1165 Type 2 diabetes mellitus with hyperglycemia: Secondary | ICD-10-CM

## 2019-09-28 MED ORDER — PEN NEEDLES 30G X 8 MM MISC: 1.0000 | Freq: Every day | 0 refills | Status: DC

## 2019-09-28 MED ORDER — TOUJEO MAX SOLOSTAR 300 UNIT/ML ~~LOC~~ SOPN: 100.0000 [IU] | PEN_INJECTOR | SUBCUTANEOUS | 3 refills | Status: DC

## 2019-09-28 NOTE — Telephone Encounter (Signed)
MEDICATION: Insulin Glargine, 2 Unit Dial, (TOUJEO MAX SOLOSTAR) 300 UNIT/ML SOPN  PHARMACY:  [facility], [place] A 90 DAY SUPPLY :   IS PATIENT OUT OF MEDICATION:   IF NOT; HOW MUCH IS LEFT:   LAST APPOINTMENT DATE: @12 /14/2020  NEXT APPOINTMENT DATE:@Visit  date not found  DO WE HAVE YOUR PERMISSION TO LEAVE A DETAILED MESSAGE:  OTHER COMMENTS: patient canalled Rx that was sent yesterday and would like it to be sent to this pharmacy instead    **Let patient know to contact pharmacy at the end of the day to make sure medication is ready. **  ** Please notify patient to allow 48-72 hours to process**  **Encourage patient to contact the pharmacy for refills or they can request refills through [facility]**

## 2019-09-28 NOTE — Telephone Encounter (Signed)
Insulin Glargine, 2 Unit Dial, (TOUJEO MAX SOLOSTAR) 300 UNIT/ML SOPN 10 pen 3 09/28/2019    Sig - Route: Inject 100 Units into the skin every morning. - Subcutaneous   Sent to pharmacy as: Insulin Glargine, 2 Unit Dial, (TOUJEO MAX SOLOSTAR) 300 UNIT/ML Solution Pen-injector   E-Prescribing Status: Receipt confirmed by pharmacy (09/28/2019 10:58 AM EST)

## 2019-12-30 ENCOUNTER — Telehealth: Payer: Self-pay | Admitting: Endocrinology

## 2019-12-30 DIAGNOSIS — E1165 Type 2 diabetes mellitus with hyperglycemia: Secondary | ICD-10-CM

## 2019-12-30 NOTE — Telephone Encounter (Signed)
Please advise if you agree to change Rx

## 2019-12-30 NOTE — Telephone Encounter (Signed)
Patient called to advise that his insurance does not cover Toujeo, but that Lantus is now covered under his insurance.  He has called this into [facility] and they will be sending a refill request.  Any questions or clarifications - patient can be reached at [phone]

## 2019-12-30 NOTE — Telephone Encounter (Signed)
I change to Lantus. 1.  Please schedule f/u appt 2.  Then please refill x 1, pending that appt.

## 2019-12-31 ENCOUNTER — Other Ambulatory Visit: Payer: Self-pay

## 2019-12-31 NOTE — Telephone Encounter (Signed)
Called pt to schedule appt as requested by Dr. Everardo All. LVM requesting returned call. Also sent the following my chart message:  Daniel Massey,   Dr. Everardo All has reviewed your request to change to Lantus due to insurance issues. However, he has advised that you call the office to schedule an appointment so that he can determine the appropriate dosage. I have called your phone and left you a message. We look forward to hearing from you soon.   Thank you  This MyChart message has not been read.

## 2019-12-31 NOTE — Telephone Encounter (Signed)
Patient is scheduled for appointment on 01/03/20 at 7:30 a.m.

## 2020-01-03 ENCOUNTER — Encounter: Payer: Self-pay | Admitting: Endocrinology

## 2020-01-03 ENCOUNTER — Other Ambulatory Visit: Payer: Self-pay

## 2020-01-03 ENCOUNTER — Ambulatory Visit (INDEPENDENT_AMBULATORY_CARE_PROVIDER_SITE_OTHER): Payer: 59 | Admitting: Endocrinology

## 2020-01-03 VITALS — BP 106/70 | HR 87 | Ht 72.0 in | Wt 316.0 lb

## 2020-01-03 DIAGNOSIS — E1165 Type 2 diabetes mellitus with hyperglycemia: Secondary | ICD-10-CM

## 2020-01-03 LAB — POCT GLYCOSYLATED HEMOGLOBIN (HGB A1C): Hemoglobin A1C: 9.6 % — AB (ref 4.0–5.6)

## 2020-01-03 MED ORDER — LANTUS SOLOSTAR 100 UNIT/ML ~~LOC~~ SOPN: 110.0000 [IU] | PEN_INJECTOR | SUBCUTANEOUS | 3 refills | Status: DC

## 2020-01-03 MED ORDER — PEN NEEDLES 30G X 8 MM MISC: 1.0000 | Freq: Every day | 0 refills | Status: DC

## 2020-01-03 NOTE — Patient Instructions (Addendum)
check your blood sugar twice a day.  vary the time of day when you check, between before the 3 meals, and at bedtime.  also check if you have symptoms of your blood sugar being too high or too low.  please keep a record of the readings and bring it to your next appointment here.  You can write it on any piece of paper.  please call us sooner if your blood sugar goes below 70, or if you have a lot of readings over 200.  On this type of insulin schedule, you should eat meals on a regular schedule.  If a meal is missed or significantly delayed, your blood sugar could go low.   Please increase the Lantus to 110 units per day.   Our goal is to minimize the humalog.  Please come back for a follow-up appointment in 2 months.

## 2020-01-03 NOTE — Progress Notes (Signed)
Subjective:    Patient ID: Daniel Massey, male    DOB: Sep 18, 1991, 29 y.o.   MRN: [id number]  HPI Pt returns for f/u of diabetes mellitus:  DM type: Insulin-requiring type 2.   Dx'ed: 4944 Complications: none Therapy: insulin since 2012 DKA: never Severe hypoglycemia: once, in Jan, 2018.  Pancreatitis: never. Other: he takes QD insulin, after poor results with multiple daily injections; he works at [employer], but now part time; ins declined [place].   Interval history: pt states he feels well in general.  no cbg record, but says cbg varies from 46-425.  He takes 100/day, of Lantus (which he says he never misses), and PRN humalog (which he averages approx 30/units day).  He has mild hypoglycemia approx twice per week.  This happens at work, when he misses lunch. Ins declined Toujeo.   Past Medical History:  Diagnosis Date  . Abscess of left hand 09/12/2015  . Diabetes mellitus without complication ([place])   . Hypertension     Past Surgical History:  Procedure Laterality Date  . I & D EXTREMITY Left 09/12/2015   Procedure: IRRIGATION AND DEBRIDEMENT LEFT RING FINGER ABSCESS;  Surgeon: Roseanne Kaufman, MD;  Location: [facility];  Service: Orthopedics;  Laterality: Left;  . ORIF ULNAR FRACTURE Right 08/2008    Social History   Socioeconomic History  . Marital status: Single    Spouse name: [name]  . Number of children: 0  . Years of education: college  . Highest education level: Not on file  Occupational History  . Occupation: MLT    Comment: [employer]  Tobacco Use  . Smoking status: Never Smoker  . Smokeless tobacco: Never Used  Substance and Sexual Activity  . Alcohol use: Yes    Alcohol/week: 2.0 standard drinks    Types: 2 Standard drinks or equivalent per week  . Drug use: No  . Sexual activity: Yes    Partners: Female  Other Topics Concern  . Not on file  Social History Narrative   Graduate of [employer] with a degree in Biology.   Lives with his girlfriend,  [name].   Family lives in [place], [place].   Social Determinants of Health   Financial Resource Strain:   . Difficulty of Paying Living Expenses:   Food Insecurity:   . Worried About Charity fundraiser in the Last Year:   . Arboriculturist in the Last Year:   Transportation Needs:   . Film/video editor (Medical):   Marland Kitchen Lack of Transportation (Non-Medical):   Physical Activity:   . Days of Exercise per Week:   . Minutes of Exercise per Session:   Stress:   . Feeling of Stress :   Social Connections:   . Frequency of Communication with Friends and Family:   . Frequency of Social Gatherings with Friends and Family:   . Attends Religious Services:   . Active Member of Clubs or Organizations:   . Attends Archivist Meetings:   Marland Kitchen Marital Status:   Intimate Partner Violence:   . Fear of Current or Ex-Partner:   . Emotionally Abused:   Marland Kitchen Physically Abused:   . Sexually Abused:     No current outpatient medications on file prior to visit.   No current facility-administered medications on file prior to visit.    Family History  Problem Relation Age of Onset  . Diabetes Mother   . Hypertension Mother   . Cancer Father 3  colon  . Hypertension Father     BP 106/70   Pulse 87   Ht 6' (1.829 m)   Wt (!) 316 lb (143.3 kg)   SpO2 95%   BMI 42.86 kg/m    Review of Systems Denies LOC.      Objective:   Physical Exam VITAL SIGNS:  See vs page GENERAL: no distress Pulses: dorsalis pedis intact bilat.   MSK: no deformity of the feet CV: no leg edema Skin:  no ulcer on the feet.  normal color and temp on the feet. Neuro: sensation is intact to touch on the feet  A1c=9.6%     Assessment & Plan:  Insulin-requiring type 2 DM: worse Hypoglycemia: this limits aggressiveness of glycemic control  Patient Instructions  check your blood sugar twice a day.  vary the time of day when you check, between before the 3 meals, and at bedtime.  also check if you  have symptoms of your blood sugar being too high or too low.  please keep a record of the readings and bring it to your next appointment here.  You can write it on any piece of paper.  please call us sooner if your blood sugar goes below 70, or if you have a lot of readings over 200.  On this type of insulin schedule, you should eat meals on a regular schedule.  If a meal is missed or significantly delayed, your blood sugar could go low.   Please increase the Lantus to 110 units per day.   Our goal is to minimize the humalog.  Please come back for a follow-up appointment in 2 months.

## 2020-01-07 NOTE — Telephone Encounter (Signed)
Encounter needs to be closed, Thank you

## 2020-01-18 ENCOUNTER — Other Ambulatory Visit: Payer: Self-pay

## 2020-01-18 MED ORDER — LANTUS SOLOSTAR 100 UNIT/ML ~~LOC~~ SOPN: 100.0000 [IU] | PEN_INJECTOR | SUBCUTANEOUS | 0 refills | Status: DC

## 2020-01-18 NOTE — Telephone Encounter (Signed)
Ammie this encounter needs to be closed, can you assist?  Thanks!

## 2020-01-18 NOTE — Telephone Encounter (Signed)
Outpatient Medication Detail   Disp Refills Start End   insulin glargine (LANTUS SOLOSTAR) 100 UNIT/ML Solostar Pen 40 pen 3 01/03/2020    Sig - Route: Inject 110 Units into the skin every morning. And pen needles 2/day - Subcutaneous   Sent to pharmacy as: insulin glargine (LANTUS SOLOSTAR) 100 UNIT/ML Solostar Pen   E-Prescribing Status: Receipt confirmed by pharmacy (01/03/2020  7:40 AM EDT)

## 2020-03-08 ENCOUNTER — Ambulatory Visit: Payer: 59 | Admitting: Endocrinology

## 2020-03-08 DIAGNOSIS — Z0289 Encounter for other administrative examinations: Secondary | ICD-10-CM

## 2020-06-09 ENCOUNTER — Ambulatory Visit: Payer: Self-pay | Admitting: Endocrinology

## 2020-06-20 ENCOUNTER — Ambulatory Visit: Payer: No Typology Code available for payment source | Admitting: Endocrinology

## 2020-06-20 ENCOUNTER — Other Ambulatory Visit: Payer: Self-pay

## 2020-06-20 ENCOUNTER — Encounter: Payer: Self-pay | Admitting: Endocrinology

## 2020-06-20 VITALS — BP 118/82 | HR 62 | Resp 18 | Ht 72.0 in | Wt 267.4 lb

## 2020-06-20 DIAGNOSIS — E1165 Type 2 diabetes mellitus with hyperglycemia: Secondary | ICD-10-CM

## 2020-06-20 LAB — POCT GLYCOSYLATED HEMOGLOBIN (HGB A1C): Hemoglobin A1C: 9 % — AB (ref 4.0–5.6)

## 2020-06-20 MED ORDER — BASAGLAR KWIKPEN 100 UNIT/ML ~~LOC~~ SOPN: 50.0000 [IU] | PEN_INJECTOR | Freq: Every day | SUBCUTANEOUS | 3 refills | Status: DC

## 2020-06-20 MED ORDER — INSULIN LISPRO (1 UNIT DIAL) 100 UNIT/ML (KWIKPEN): 10.0000 [IU] | PEN_INJECTOR | Freq: Three times a day (TID) | SUBCUTANEOUS | 11 refills | Status: DC

## 2020-06-20 MED ORDER — TRULICITY 0.75 MG/0.5ML ~~LOC~~ SOAJ: 0.7500 mg | SUBCUTANEOUS | 3 refills | Status: DC

## 2020-06-20 NOTE — Progress Notes (Signed)
Subjective:    Patient ID: Daniel Massey, male    DOB: April 18, 1991, 29 y.o.   MRN: [id number]  HPI Pt returns for f/u of diabetes mellitus:  DM type: Insulin-requiring type 2.   Dx'ed: 2011 Complications: none Therapy: insulin since 2012 DKA: never Severe hypoglycemia: once, in Jan, 2018.  Pancreatitis: never. SDOH: he works at [employer], 3rd shift.    Other: he has done poorly on several regimens.   Interval history: pt states he feels well in general.  no cbg record, but says cbg varies from 40-500.  It is in general higher PC than AC.  He takes 60/day, of Lantus (which he says he never misses), and PRN humalog (which he averages approx 25/units day).  He has mild hypoglycemia approx twice per week.  This happens at work, when he misses lunch.  Ins now prefers Forensic psychologist.   Past Medical History:  Diagnosis Date  . Abscess of left hand 09/12/2015  . Diabetes mellitus without complication ([facility])   . Hypertension     Past Surgical History:  Procedure Laterality Date  . I & D EXTREMITY Left 09/12/2015   Procedure: IRRIGATION AND DEBRIDEMENT LEFT RING FINGER ABSCESS;  Surgeon: Dominica Severin, MD;  Location[facility];  Service: Orthopedics;  Laterality: Left;  . ORIF ULNAR FRACTURE Right 08/2008    Social History   Socioeconomic History  . Marital status: Single    Spouse name: [name]  . Number of children: 0  . Years of education: college  . Highest education level: Not on file  Occupational History  . Occupation: MLT    Comment: [employer]  Tobacco Use  . Smoking status: Never Smoker  . Smokeless tobacco: Never Used  Substance and Sexual Activity  . Alcohol use: Yes    Alcohol/week: 2.0 standard drinks    Types: 2 Standard drinks or equivalent per week  . Drug use: No  . Sexual activity: Yes    Partners: Female  Other Topics Concern  . Not on file  Social History Narrative   Graduate of [employer] with a degree in Biology.   Lives with his  girlfriend, Lorane Gell.   Family lives in [place], [place].   Social Determinants of Health   Financial Resource Strain:   . Difficulty of Paying Living Expenses: Not on file  Food Insecurity:   . Worried About Programme researcher, broadcasting/film/video in the Last Year: Not on file  . Ran Out of Food in the Last Year: Not on file  Transportation Needs:   . Lack of Transportation (Medical): Not on file  . Lack of Transportation (Non-Medical): Not on file  Physical Activity:   . Days of Exercise per Week: Not on file  . Minutes of Exercise per Session: Not on file  Stress:   . Feeling of Stress : Not on file  Social Connections:   . Frequency of Communication with Friends and Family: Not on file  . Frequency of Social Gatherings with Friends and Family: Not on file  . Attends Religious Services: Not on file  . Active Member of Clubs or Organizations: Not on file  . Attends Banker Meetings: Not on file  . Marital Status: Not on file  Intimate Partner Violence:   . Fear of Current or Ex-Partner: Not on file  . Emotionally Abused: Not on file  . Physically Abused: Not on file  . Sexually Abused: Not on file    Current Outpatient Medications on File Prior to  Visit  Medication Sig Dispense Refill  . Insulin Pen Needle (PEN NEEDLES) 30G X 8 MM MISC 1 each by Does not apply route daily. E11.9 90 each 0   No current facility-administered medications on file prior to visit.    Allergies  Allergen Reactions  . Penicillins     Just didn't work Has patient had a PCN reaction causing immediate rash, facial/tongue/throat swelling, SOB or lightheadedness with hypotension: unknown Has patient had a PCN reaction causing severe rash involving mucus membranes or skin necrosis: unknown Has patient had a PCN reaction that required hospitalization: Unknown Has patient had a PCN reaction occurring within the last 10 years: Unknown If all of the above answers are "NO", then may proceed with Cephalosporin  use.     Family History  Problem Relation Age of Onset  . Diabetes Mother   . Hypertension Mother   . Cancer Father 17       colon  . Hypertension Father     BP 118/82   Pulse 62   Resp 18   Ht 6' (1.829 m)   Wt 267 lb 6.4 oz (121.3 kg)   SpO2 98%   BMI 36.27 kg/m   Review of Systems Denies LOC    Objective:   Physical Exam VITAL SIGNS:  See vs page GENERAL: no distress Pulses: dorsalis pedis intact bilat.   MSK: no deformity of the feet CV: no leg edema Skin:  no ulcer on the feet.  normal color and temp on the feet. Neuro: sensation is intact to touch on the feet  Lab Results  Component Value Date   HGBA1C 9.0 (A) 06/20/2020        Assessment & Plan:  Insulin-requiring type 2 DM: uncontrolled Hypoglycemia, due to insulin: this limits aggressiveness of glycemic control.     Patient Instructions  check your blood sugar twice a day.  vary the time of day when you check, between before the 3 meals, and at bedtime.  also check if you have symptoms of your blood sugar being too high or too low.  please keep a record of the readings and bring it to your next appointment here.  You can write it on any piece of paper.  please call us sooner if your blood sugar goes below 70, or if you have a lot of readings over 200.  On this type of insulin schedule, you should eat meals on a regular schedule.  If a meal is missed or significantly delayed, your blood sugar could go low.   Please take the meds as on the list below.   Please come back for a follow-up appointment in 2 months.

## 2020-06-20 NOTE — Patient Instructions (Addendum)
check your blood sugar twice a day.  vary the time of day when you check, between before the 3 meals, and at bedtime.  also check if you have symptoms of your blood sugar being too high or too low.  please keep a record of the readings and bring it to your next appointment here.  You can write it on any piece of paper.  please call us sooner if your blood sugar goes below 70, or if you have a lot of readings over 200.  On this type of insulin schedule, you should eat meals on a regular schedule.  If a meal is missed or significantly delayed, your blood sugar could go low.   Please take the meds as on the list below.   Please come back for a follow-up appointment in 2 months.

## 2020-06-26 ENCOUNTER — Other Ambulatory Visit: Payer: Self-pay

## 2020-06-26 ENCOUNTER — Telehealth: Payer: Self-pay | Admitting: Endocrinology

## 2020-06-26 DIAGNOSIS — E1165 Type 2 diabetes mellitus with hyperglycemia: Secondary | ICD-10-CM

## 2020-06-26 MED ORDER — LANTUS SOLOSTAR 100 UNIT/ML ~~LOC~~ SOPN: PEN_INJECTOR | SUBCUTANEOUS | 2 refills | Status: DC

## 2020-06-26 NOTE — Telephone Encounter (Signed)
Please review and advise.

## 2020-06-26 NOTE — Telephone Encounter (Signed)
Lantus is OK

## 2020-06-26 NOTE — Telephone Encounter (Signed)
Outpatient Medication Detail   Disp Refills Start End   insulin glargine (LANTUS SOLOSTAR) 100 UNIT/ML Solostar Pen 15 mL 2 06/26/2020    Sig: Inject 0.5 mLs (50 Units total) into the skin daily.   Sent to pharmacy as: insulin glargine (LANTUS SOLOSTAR) 100 UNIT/ML Solostar Pen   E-Prescribing Status: Receipt confirmed by pharmacy (06/26/2020 11:28 AM EDT)

## 2020-06-26 NOTE — Telephone Encounter (Signed)
Patient called to advise that Daniel Massey is not covered by his insurance.  He did find out that Lantus is covered and would like to know if he can have that sent to [facility] on [address] in [place] to replace the [employer]

## 2020-08-22 ENCOUNTER — Ambulatory Visit: Payer: No Typology Code available for payment source | Admitting: Endocrinology

## 2020-09-15 ENCOUNTER — Telehealth: Payer: Self-pay | Admitting: Endocrinology

## 2020-09-15 NOTE — Telephone Encounter (Signed)
please contact patient: F/u appt is due.  In order to avoid d/c from practice, it is important for your health to make and keep appts

## 2020-09-16 ENCOUNTER — Ambulatory Visit: Payer: No Typology Code available for payment source | Attending: Internal Medicine

## 2020-09-16 DIAGNOSIS — Z23 Encounter for immunization: Secondary | ICD-10-CM

## 2020-09-16 NOTE — Progress Notes (Signed)
   Covid-19 Vaccination Clinic  Name:  Daniel Massey    MRN: [id number] DOB: May 20, 1991  09/16/2020  Mr. Daniel Massey was observed post Covid-19 immunization for 15 minutes without incident. He was provided with Vaccine Information Sheet and instruction to access the V-Safe system.   Mr. Daniel Massey was instructed to call 911 with any severe reactions post vaccine: Marland Kitchen Difficulty breathing  . Swelling of face and throat  . A fast heartbeat  . A bad rash all over body  . Dizziness and weakness   Immunizations Administered    Name Date Dose VIS Date Route   Pfizer COVID-19 Vaccine 09/16/2020 10:53 AM 0.3 mL 08/02/2020 Intramuscular   Manufacturer: [employer], [employer]   Lot: [id number]   NDC: [address]-1

## 2020-09-18 ENCOUNTER — Telehealth: Payer: Self-pay | Admitting: *Deleted

## 2020-09-18 NOTE — Telephone Encounter (Signed)
Daniel Massey called from [facility] to follow up on a prescription request for the glucose monitor and device. Please advise [phone]

## 2020-09-18 NOTE — Telephone Encounter (Signed)
Called, and notified that we needed to have patient be seen- notified admin staff to call patient. [facility] noted in patients chart.

## 2020-09-22 NOTE — Telephone Encounter (Signed)
LVM and sent mychart message to reschedule last appointment.

## 2020-12-25 ENCOUNTER — Encounter: Payer: Self-pay | Admitting: Endocrinology

## 2020-12-28 ENCOUNTER — Ambulatory Visit: Payer: No Typology Code available for payment source | Admitting: Endocrinology

## 2020-12-28 ENCOUNTER — Other Ambulatory Visit: Payer: Self-pay

## 2020-12-28 ENCOUNTER — Encounter: Payer: Self-pay | Admitting: Endocrinology

## 2020-12-28 VITALS — BP 108/80 | HR 88 | Ht 72.0 in | Wt 251.0 lb

## 2020-12-28 DIAGNOSIS — E1165 Type 2 diabetes mellitus with hyperglycemia: Secondary | ICD-10-CM

## 2020-12-28 LAB — POCT GLYCOSYLATED HEMOGLOBIN (HGB A1C): Hemoglobin A1C: 9.7 % — AB (ref 4.0–5.6)

## 2020-12-28 MED ORDER — LANTUS SOLOSTAR 100 UNIT/ML ~~LOC~~ SOPN: 60.0000 [IU] | PEN_INJECTOR | SUBCUTANEOUS | 3 refills | Status: DC

## 2020-12-28 MED ORDER — INSULIN LISPRO (1 UNIT DIAL) 100 UNIT/ML (KWIKPEN): PEN_INJECTOR | SUBCUTANEOUS | 3 refills | Status: DC

## 2020-12-28 MED ORDER — FREESTYLE LIBRE 2 READER DEVI: 1.0000 | Freq: Once | 1 refills | Status: AC

## 2020-12-28 MED ORDER — FREESTYLE LIBRE 2 SENSOR MISC: 1.0000 | 3 refills | Status: DC

## 2020-12-28 MED ORDER — FREESTYLE LIBRE 2 READER DEVI: 1.0000 | Freq: Once | 1 refills | Status: DC

## 2020-12-28 NOTE — Patient Instructions (Addendum)
check your blood sugar twice a day.  vary the time of day when you check, between before the 3 meals, and at bedtime.  also check if you have symptoms of your blood sugar being too high or too low.  please keep a record of the readings and bring it to your next appointment here.  You can write it on any piece of paper.  please call us sooner if your blood sugar goes below 70, or if you have a lot of readings over 200.  On this type of insulin schedule, you should eat meals on a regular schedule.  If a meal is missed or significantly delayed, your blood sugar could go low.   I have sent a prescription to your pharmacy, to increase the Lantus to 60 units every morning.  Our goal is to reduce your need for the Humalog Please come back for a follow-up appointment in 4 months.

## 2020-12-28 NOTE — Progress Notes (Signed)
Subjective:    Patient ID: Daniel Massey, male    DOB: 1991-07-17, 30 y.o.   MRN: [id number]  HPI Pt returns for f/u of diabetes mellitus:  DM type: Insulin-requiring type 2.   Dx'ed: 2011 Complications: none Therapy: insulin since 2012 DKA: never Severe hypoglycemia: once, in 2018.  Pancreatitis: never. SDOH: he works at [employer], 3rd shift.    Other: he has done poorly on several regimens; ins declined Trulicity   Interval history: pt states he feels well in general.  no cbg record, but says cbg varies from 46-500.  It is in general higher PC than AC.  He takes 50/day, of Lantus (which he says he never misses), and PRN humalog (which he averages approx 30/units day).  He has mild hypoglycemia approx twice per week.  This happens at work, when he misses lunch.  He will work in [employer] state x 3 mos soon.  Pt says routine labs were normal at primary care provider last month.  Past Medical History:  Diagnosis Date  . Abscess of left hand 09/12/2015  . Diabetes mellitus without complication ([facility])   . Hypertension     Past Surgical History:  Procedure Laterality Date  . I & D EXTREMITY Left 09/12/2015   Procedure: IRRIGATION AND DEBRIDEMENT LEFT RING FINGER ABSCESS;  Surgeon: Dominica Severin, MD;  Location: [facility];  Service: Orthopedics;  Laterality: Left;  . ORIF ULNAR FRACTURE Right 08/2008    Social History   Socioeconomic History  . Marital status: Single    Spouse name: [name]  . Number of children: 0  . Years of education: college  . Highest education level: Not on file  Occupational History  . Occupation: MLT    Comment: [employer]  Tobacco Use  . Smoking status: Never Smoker  . Smokeless tobacco: Never Used  Substance and Sexual Activity  . Alcohol use: Yes    Alcohol/week: 2.0 standard drinks    Types: 2 Standard drinks or equivalent per week  . Drug use: No  . Sexual activity: Yes    Partners: Female  Other Topics Concern  . Not on file  Social  History Narrative   Graduate of [employer] with a degree in Biology.   Lives with his girlfriend, Lorane Gell.   Family lives in [place], [place].   Social Determinants of Health   Financial Resource Strain: Not on file  Food Insecurity: Not on file  Transportation Needs: Not on file  Physical Activity: Not on file  Stress: Not on file  Social Connections: Not on file  Intimate Partner Violence: Not on file    Current Outpatient Medications on File Prior to Visit  Medication Sig Dispense Refill  . Insulin Pen Needle (PEN NEEDLES) 30G X 8 MM MISC 1 each by Does not apply route daily. E11.9 90 each 0   No current facility-administered medications on file prior to visit.    Allergies  Allergen Reactions  . Penicillins     Just didn't work Has patient had a PCN reaction causing immediate rash, facial/tongue/throat swelling, SOB or lightheadedness with hypotension: unknown Has patient had a PCN reaction causing severe rash involving mucus membranes or skin necrosis: unknown Has patient had a PCN reaction that required hospitalization: Unknown Has patient had a PCN reaction occurring within the last 10 years: Unknown If all of the above answers are "NO", then may proceed with Cephalosporin use.     Family History  Problem Relation Age of Onset  . Diabetes Mother   .  Hypertension Mother   . Cancer Father 73       colon  . Hypertension Father     BP 108/80 (BP Location: Right Arm, Patient Position: Sitting, Cuff Size: Large)   Pulse 88   Ht 6' (1.829 m)   Wt 251 lb (113.9 kg)   SpO2 96%   BMI 34.04 kg/m    Review of Systems     Objective:   Physical Exam VITAL SIGNS:  See vs page GENERAL: no distress Pulses: dorsalis pedis intact bilat.   MSK: no deformity of the feet CV: no leg edema Skin:  no ulcer on the feet.  normal color and temp on the feet.   Neuro: sensation is intact to touch on the feet.    A1c=9.7%     Assessment & Plan:  Insulin-requiring type 2 DM:  uncontrolled Hypoglycemia, due to insulin: this limits aggressiveness of glycemic control.    Patient Instructions  check your blood sugar twice a day.  vary the time of day when you check, between before the 3 meals, and at bedtime.  also check if you have symptoms of your blood sugar being too high or too low.  please keep a record of the readings and bring it to your next appointment here.  You can write it on any piece of paper.  please call us sooner if your blood sugar goes below 70, or if you have a lot of readings over 200.  On this type of insulin schedule, you should eat meals on a regular schedule.  If a meal is missed or significantly delayed, your blood sugar could go low.   I have sent a prescription to your pharmacy, to increase the Lantus to 60 units every morning.  Our goal is to reduce your need for the Humalog Please come back for a follow-up appointment in 4 months.

## 2020-12-29 MED ORDER — DEXCOM G6 TRANSMITTER MISC: 1.0000 | Freq: Once | 1 refills | Status: AC

## 2020-12-29 MED ORDER — DEXCOM G6 RECEIVER DEVI: 1.0000 | Freq: Once | 1 refills | Status: AC

## 2020-12-29 MED ORDER — DEXCOM G6 SENSOR MISC: 1.0000 | 3 refills | Status: DC

## 2021-02-19 ENCOUNTER — Encounter: Payer: Self-pay | Admitting: Endocrinology

## 2021-02-20 ENCOUNTER — Other Ambulatory Visit: Payer: Self-pay | Admitting: Endocrinology

## 2021-02-20 DIAGNOSIS — E1165 Type 2 diabetes mellitus with hyperglycemia: Secondary | ICD-10-CM

## 2021-02-20 MED ORDER — BASAGLAR KWIKPEN 100 UNIT/ML ~~LOC~~ SOPN: 60.0000 [IU] | PEN_INJECTOR | Freq: Every day | SUBCUTANEOUS | 3 refills | Status: DC

## 2021-02-20 MED ORDER — NOVOLOG FLEXPEN 100 UNIT/ML ~~LOC~~ SOPN: PEN_INJECTOR | SUBCUTANEOUS | 1 refills | Status: DC

## 2021-02-20 MED ORDER — PEN NEEDLES 30G X 8 MM MISC: 1.0000 | Freq: Every day | 0 refills | Status: AC

## 2021-02-20 MED ORDER — DEXCOM G6 SENSOR MISC: 1.0000 | 3 refills | Status: DC

## 2021-02-22 ENCOUNTER — Other Ambulatory Visit: Payer: Self-pay | Admitting: Endocrinology

## 2021-02-22 MED ORDER — INSULIN GLARGINE-YFGN 100 UNIT/ML ~~LOC~~ SOPN: 60.0000 [IU] | PEN_INJECTOR | SUBCUTANEOUS | 3 refills | Status: DC

## 2021-02-26 ENCOUNTER — Other Ambulatory Visit: Payer: Self-pay | Admitting: Endocrinology

## 2021-02-26 MED ORDER — BASAGLAR KWIKPEN 100 UNIT/ML ~~LOC~~ SOPN: 60.0000 [IU] | PEN_INJECTOR | SUBCUTANEOUS | 3 refills | Status: DC

## 2021-05-01 ENCOUNTER — Other Ambulatory Visit: Payer: Self-pay

## 2021-05-01 DIAGNOSIS — E1165 Type 2 diabetes mellitus with hyperglycemia: Secondary | ICD-10-CM

## 2021-05-01 MED ORDER — DEXCOM G6 TRANSMITTER MISC: 0 refills | Status: DC

## 2021-05-11 ENCOUNTER — Ambulatory Visit: Payer: No Typology Code available for payment source | Admitting: Endocrinology

## 2021-08-05 ENCOUNTER — Other Ambulatory Visit: Payer: Self-pay | Admitting: Endocrinology

## 2021-08-10 ENCOUNTER — Ambulatory Visit (INDEPENDENT_AMBULATORY_CARE_PROVIDER_SITE_OTHER): Payer: 59 | Admitting: Endocrinology

## 2021-08-10 ENCOUNTER — Encounter: Payer: Self-pay | Admitting: Endocrinology

## 2021-08-10 ENCOUNTER — Other Ambulatory Visit: Payer: Self-pay

## 2021-08-10 VITALS — BP 110/60 | HR 92 | Ht 72.0 in | Wt 284.0 lb

## 2021-08-10 DIAGNOSIS — E1165 Type 2 diabetes mellitus with hyperglycemia: Secondary | ICD-10-CM

## 2021-08-10 LAB — POCT GLYCOSYLATED HEMOGLOBIN (HGB A1C): Hemoglobin A1C: 8.3 % — AB (ref 4.0–5.6)

## 2021-08-10 MED ORDER — TRULICITY 0.75 MG/0.5ML ~~LOC~~ SOAJ: 0.7500 mg | SUBCUTANEOUS | 3 refills | Status: DC

## 2021-08-10 MED ORDER — BASAGLAR KWIKPEN 100 UNIT/ML ~~LOC~~ SOPN: 50.0000 [IU] | PEN_INJECTOR | SUBCUTANEOUS | 3 refills | Status: DC

## 2021-08-10 NOTE — Patient Instructions (Signed)
check your blood sugar twice a day.  vary the time of day when you check, between before the 3 meals, and at bedtime.  also check if you have symptoms of your blood sugar being too high or too low.  please keep a record of the readings and bring it to your next appointment here.  You can write it on any piece of paper.  please call us sooner if your blood sugar goes below 70, or if you have a lot of readings over 200.  On this type of insulin schedule, you should eat meals on a regular schedule.  If a meal is missed or significantly delayed, your blood sugar could go low.   I have sent a prescription to your pharmacy, to add Trulicity.   Please continue the same insulin.   Please come back for a follow-up appointment in 6 weeks.

## 2021-08-10 NOTE — Progress Notes (Signed)
Subjective:    Patient ID: Daniel Massey, male    DOB: 19-Nov-1990, 30 y.o.   MRN: [id number]  HPI Pt returns for f/u of diabetes mellitus:  DM type: Insulin-requiring type 2.   Dx'ed: 2011 Complications: none Therapy: insulin since 2012 DKA: never Severe hypoglycemia: once, in 2018.  Pancreatitis: never. 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    Other: he has done poorly on several regimens; ins declined Trulicity.   Interval history: pt states he feels well in general.  I reviewed continuous glucose monitor data.  glucose varies from 70-315.  It is in general highest at 5PM, and less so at 8AM and 10PM.  It is in general lowest at 2AM.  He has mild hypoglycemia approx QOD.  This happens at work, when he lunch is delayed.  He says cbg's are much lower on work days.  Therefore, he takes just 40 units of Basaglar on work days, and 50 on days off.  He averages approx 10 units of Novolog per meal.   Past Medical History:  Diagnosis Date   Abscess of left hand 09/12/2015   Diabetes mellitus without complication (HCC)    Hypertension     Past Surgical History:  Procedure Laterality Date   I & D EXTREMITY Left 09/12/2015   Procedure: IRRIGATION AND DEBRIDEMENT LEFT RING FINGER ABSCESS;  Surgeon: Dominica Severin, MD;  Location: [facility];  Service: Orthopedics;  Laterality: Left;   ORIF ULNAR FRACTURE Right 08/2008    Social History   Socioeconomic History   Marital status: Single    Spouse name: [name]   Number of children: 0   Years of education: college   Highest education level: Not on file  Occupational History   Occupation: MLT    Comment: [employer]  Tobacco Use   Smoking status: Never   Smokeless tobacco: Never  Substance and Sexual Activity   Alcohol use: Yes    Alcohol/week: 2.0 standard drinks    Types: 2 Standard drinks or equivalent per week   Drug use: No   Sexual activity: Yes    Partners: Female  Other Topics Concern   Not on file  Social History Narrative    Graduate of [employer] with a degree in Biology.   Lives with his girlfriend, Lorane Gell.   Family lives in [place], [place].   Social Determinants of Health   Financial Resource Strain: Not on file  Food Insecurity: Not on file  Transportation Needs: Not on file  Physical Activity: Not on file  Stress: Not on file  Social Connections: Not on file  Intimate Partner Violence: Not on file    Current Outpatient Medications on File Prior to Visit  Medication Sig Dispense Refill   Continuous Blood Gluc Sensor (DEXCOM G6 SENSOR) MISC 1 Device by Does not apply route See admin instructions. Change every 10 days 9 each 3   Continuous Blood Gluc Transmit (DEXCOM G6 TRANSMITTER) MISC Use as directed to check BS 1 each 0   insulin aspart (NOVOLOG FLEXPEN) 100 UNIT/ML FlexPen For PRN use, total of 30 units per day 30 mL 1   Insulin Pen Needle (PEN NEEDLES) 30G X 8 MM MISC 1 each by Does not apply route daily. E11.9 90 each 0   No current facility-administered medications on file prior to visit.    Allergies  Allergen Reactions   Penicillins     Just didn't work Has patient had a PCN reaction causing immediate rash, facial/tongue/throat swelling, SOB or lightheadedness with hypotension: unknown  Has patient had a PCN reaction causing severe rash involving mucus membranes or skin necrosis: unknown Has patient had a PCN reaction that required hospitalization: Unknown Has patient had a PCN reaction occurring within the last 10 years: Unknown If all of the above answers are "NO", then may proceed with Cephalosporin use.     Family History  Problem Relation Age of Onset   Diabetes Mother    Hypertension Mother    Cancer Father 81       colon   Hypertension Father     BP 110/60 (BP Location: Right Arm, Patient Position: Sitting, Cuff Size: Large)   Pulse 92   Ht 6' (1.829 m)   Wt 284 lb (128.8 kg)   SpO2 97%   BMI 38.52 kg/m    Review of Systems Denies LOC    Objective:   Physical  Exam    Lab Results  Component Value Date   HGBA1C 8.3 (A) 08/10/2021      Assessment & Plan:  Insulin-requiring type 2 DM: uncontrolled Hypoglycemia, due to insulin. We'll favor GLP rx.   Patient Instructions  check your blood sugar twice a day.  vary the time of day when you check, between before the 3 meals, and at bedtime.  also check if you have symptoms of your blood sugar being too high or too low.  please keep a record of the readings and bring it to your next appointment here.  You can write it on any piece of paper.  please call us sooner if your blood sugar goes below 70, or if you have a lot of readings over 200.  On this type of insulin schedule, you should eat meals on a regular schedule.  If a meal is missed or significantly delayed, your blood sugar could go low.   I have sent a prescription to your pharmacy, to add Trulicity.   Please continue the same insulin.   Please come back for a follow-up appointment in 6 weeks.

## 2021-08-16 ENCOUNTER — Encounter: Payer: Self-pay | Admitting: Endocrinology

## 2021-08-17 ENCOUNTER — Other Ambulatory Visit: Payer: Self-pay

## 2021-08-17 DIAGNOSIS — E1165 Type 2 diabetes mellitus with hyperglycemia: Secondary | ICD-10-CM

## 2021-08-17 MED ORDER — NOVOLOG FLEXPEN 100 UNIT/ML ~~LOC~~ SOPN: PEN_INJECTOR | SUBCUTANEOUS | 1 refills | Status: DC

## 2021-08-20 ENCOUNTER — Other Ambulatory Visit: Payer: Self-pay

## 2021-08-20 DIAGNOSIS — E1165 Type 2 diabetes mellitus with hyperglycemia: Secondary | ICD-10-CM

## 2021-08-20 MED ORDER — NOVOLOG FLEXPEN 100 UNIT/ML ~~LOC~~ SOPN: PEN_INJECTOR | SUBCUTANEOUS | 1 refills | Status: DC

## 2021-09-24 ENCOUNTER — Ambulatory Visit: Payer: No Typology Code available for payment source | Admitting: Endocrinology

## 2021-10-31 ENCOUNTER — Other Ambulatory Visit: Payer: Self-pay | Admitting: Endocrinology

## 2021-10-31 DIAGNOSIS — E1165 Type 2 diabetes mellitus with hyperglycemia: Secondary | ICD-10-CM

## 2021-10-31 MED ORDER — DEXCOM G6 TRANSMITTER MISC: 0 refills | Status: DC

## 2021-11-07 ENCOUNTER — Ambulatory Visit (LOCAL_COMMUNITY_HEALTH_CENTER): Payer: Self-pay

## 2021-11-07 ENCOUNTER — Other Ambulatory Visit: Payer: Self-pay

## 2021-11-07 DIAGNOSIS — Z23 Encounter for immunization: Secondary | ICD-10-CM

## 2021-11-07 DIAGNOSIS — Z719 Counseling, unspecified: Secondary | ICD-10-CM

## 2021-11-07 NOTE — Progress Notes (Signed)
Pt in cliniic to have MMR vaccine required for work. Pulled out pt's vaccine record from Epic, MMR titer was done on 01/03/21, mumps titer showed low immunity. Pt brought mmr vaccine record that he had on 01/25/21 at  [facility]. Second dose of MMR vaccine given to left upper posterior arm, tolerated well. Updated [facility] copies given to pt. M. Jones, LPN. °

## 2021-11-09 ENCOUNTER — Encounter: Payer: Self-pay | Admitting: Endocrinology

## 2021-11-09 ENCOUNTER — Ambulatory Visit (INDEPENDENT_AMBULATORY_CARE_PROVIDER_SITE_OTHER): Payer: 59 | Admitting: Endocrinology

## 2021-11-09 ENCOUNTER — Other Ambulatory Visit: Payer: Self-pay

## 2021-11-09 VITALS — BP 130/90 | HR 88 | Ht 72.0 in | Wt 288.6 lb

## 2021-11-09 DIAGNOSIS — E1165 Type 2 diabetes mellitus with hyperglycemia: Secondary | ICD-10-CM

## 2021-11-09 LAB — POCT GLYCOSYLATED HEMOGLOBIN (HGB A1C): Hemoglobin A1C: 8.2 % — AB (ref 4.0–5.6)

## 2021-11-09 MED ORDER — TRULICITY 1.5 MG/0.5ML ~~LOC~~ SOAJ: 1.5000 mg | SUBCUTANEOUS | 3 refills | Status: DC

## 2021-11-09 NOTE — Progress Notes (Signed)
Subjective:    Patient ID: Daniel Massey, male    DOB: [birth year], 31 y.o.   MRN: [id number]  HPI Pt returns for f/u of diabetes mellitus:  DM type: Insulin-requiring type 2.   Dx'ed: [id number] Complications: none Therapy: insulin since 2012 DKA: never Severe hypoglycemia: once, in 2018.  Pancreatitis: never. 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    Other: he has done poorly on several regimens; ins declined Trulicity.   Interval history: pt states he feels well in general.  I reviewed continuous glucose monitor data.  glucose varies from 75-300.  It is in general highest at [facility], and less so at 1AM.  It is in general lowest at [place].  It is flat overnight.  He still has mild hypoglycemia approx QOD.  This happens at work.  Therefore, he takes just 40 units of Basaglar on work days, and 50 on days off.  He averages approx 10 units of Novolog per meal.   Past Medical History:  Diagnosis Date   Abscess of left hand 09/12/2015   Diabetes mellitus without complication ([place])    Hypertension     Past Surgical History:  Procedure Laterality Date   I & D EXTREMITY Left 09/12/2015   Procedure: IRRIGATION AND DEBRIDEMENT LEFT RING FINGER ABSCESS;  Surgeon: Roseanne Kaufman, MD;  Location: [facility];  Service: Orthopedics;  Laterality: Left;   ORIF ULNAR FRACTURE Right 08/2008    Social History   Socioeconomic History   Marital status: Married    Spouse name: [name]   Number of children: 0   Years of education: college   Highest education level: Not on file  Occupational History   Occupation: MLT    Comment: [employer]  Tobacco Use   Smoking status: Never   Smokeless tobacco: Never  Substance and Sexual Activity   Alcohol use: Yes    Alcohol/week: 2.0 standard drinks    Types: 2 Standard drinks or equivalent per week   Drug use: No   Sexual activity: Yes    Partners: Female  Other Topics Concern   Not on file  Social History Narrative   Graduate of [employer] with a degree in Biology.    Lives with his girlfriend, Daniel Massey.   Family lives in [place], [place].   Social Determinants of Health   Financial Resource Strain: Not on file  Food Insecurity: Not on file  Transportation Needs: Not on file  Physical Activity: Not on file  Stress: Not on file  Social Connections: Not on file  Intimate Partner Violence: Not on file    Current Outpatient Medications on File Prior to Visit  Medication Sig Dispense Refill   Continuous Blood Gluc Sensor (DEXCOM G6 SENSOR) MISC 1 Device by Does not apply route See admin instructions. Change every 10 days 9 each 3   Continuous Blood Gluc Transmit (DEXCOM G6 TRANSMITTER) MISC Use as directed to check BS 1 each 0   insulin aspart (NOVOLOG FLEXPEN) 100 UNIT/ML FlexPen For PRN use, total of 30 units per day 30 mL 1   Insulin Glargine (BASAGLAR KWIKPEN) 100 UNIT/ML Inject 50 Units into the skin every morning. 60 mL 3   Insulin Pen Needle (PEN NEEDLES) 30G X 8 MM MISC 1 each by Does not apply route daily. E11.9 90 each 0   No current facility-administered medications on file prior to visit.    Allergies  Allergen Reactions   Penicillins     Just didn't work Has patient had a PCN reaction causing immediate rash,  facial/tongue/throat swelling, SOB or lightheadedness with hypotension: unknown Has patient had a PCN reaction causing severe rash involving mucus membranes or skin necrosis: unknown Has patient had a PCN reaction that required hospitalization: Unknown Has patient had a PCN reaction occurring within the last 10 years: Unknown If all of the above answers are "NO", then may proceed with Cephalosporin use.     Family History  Problem Relation Age of Onset   Diabetes Mother    Hypertension Mother    Cancer Father 54       colon   Hypertension Father     BP 130/90    Pulse 88    Ht 6' (1.829 m)    Wt 288 lb 9.6 oz (130.9 kg)    SpO2 98%    BMI 39.14 kg/m    Review of Systems Denies N/V/HB    Objective:   Physical  Exam    Lab Results  Component Value Date   HGBA1C 8.2 (A) 11/09/2021      Assessment & Plan:  Insulin-requiring type 2 DM: uncontrolled Hypoglycemia, due to insulin: we'll favor GLP rx.   Patient Instructions  check your blood sugar twice a day.  vary the time of day when you check, between before the 3 meals, and at bedtime.  also check if you have symptoms of your blood sugar being too high or too low.  please keep a record of the readings and bring it to your next appointment here.  You can write it on any piece of paper.  please call us sooner if your blood sugar goes below 70, or if you have a lot of readings over 200.  On this type of insulin schedule, you should eat meals on a regular schedule.  If a meal is missed or significantly delayed, your blood sugar could go low.   I have sent a prescription to your pharmacy, to double the Trulicity.   Please continue the same insulin.   Please come back for a follow-up appointment in 2 months.

## 2021-11-09 NOTE — Patient Instructions (Addendum)
check your blood sugar twice a day.  vary the time of day when you check, between before the 3 meals, and at bedtime.  also check if you have symptoms of your blood sugar being too high or too low.  please keep a record of the readings and bring it to your next appointment here.  You can write it on any piece of paper.  please call us sooner if your blood sugar goes below 70, or if you have a lot of readings over 200.  On this type of insulin schedule, you should eat meals on a regular schedule.  If a meal is missed or significantly delayed, your blood sugar could go low.   I have sent a prescription to your pharmacy, to double the Trulicity.   Please continue the same insulin.   Please come back for a follow-up appointment in 2 months.

## 2021-11-30 ENCOUNTER — Encounter: Payer: Self-pay | Admitting: Endocrinology

## 2021-11-30 DIAGNOSIS — E1165 Type 2 diabetes mellitus with hyperglycemia: Secondary | ICD-10-CM

## 2021-11-30 MED ORDER — DEXCOM G6 SENSOR MISC: 1.0000 | 3 refills | Status: DC

## 2022-01-17 ENCOUNTER — Ambulatory Visit (INDEPENDENT_AMBULATORY_CARE_PROVIDER_SITE_OTHER): Payer: 59 | Admitting: Endocrinology

## 2022-01-17 ENCOUNTER — Encounter: Payer: Self-pay | Admitting: Endocrinology

## 2022-01-17 DIAGNOSIS — Z794 Long term (current) use of insulin: Secondary | ICD-10-CM

## 2022-01-17 DIAGNOSIS — E119 Type 2 diabetes mellitus without complications: Secondary | ICD-10-CM

## 2022-01-17 DIAGNOSIS — E1165 Type 2 diabetes mellitus with hyperglycemia: Secondary | ICD-10-CM

## 2022-01-17 MED ORDER — TRULICITY 3 MG/0.5ML ~~LOC~~ SOAJ: 3.0000 mg | SUBCUTANEOUS | 3 refills | Status: DC

## 2022-01-17 NOTE — Patient Instructions (Addendum)
check your blood sugar twice a day.  vary the time of day when you check, between before the 3 meals, and at bedtime.  also check if you have symptoms of your blood sugar being too high or too low.  please keep a record of the readings and bring it to your next appointment here.  You can write it on any piece of paper.  please call us sooner if your blood sugar goes below 70, or if you have a lot of readings over 200.  ?On this type of insulin schedule, you should eat meals on a regular schedule.  If a meal is missed or significantly delayed, your blood sugar could go low.   ?I have sent a prescription to your pharmacy, to double the Trulicity again.   ?Please continue the same insulin.   ?You should have a follow-up appointment in 3 months.   ?

## 2022-01-17 NOTE — Progress Notes (Signed)
? ?Subjective:  ? ? Patient ID: Daniel Massey, male    DOB: Apr 25, 1991, 31 y.o.   MRN: [id number] ? ?HPI ?Pt returns for f/u of diabetes mellitus:  ?DM type: Insulin-requiring type 2.   ?Dx'ed: 2011 ?Complications: none.  ?Therapy: insulin since 2012.   ?DKA: never ?Severe hypoglycemia: once, in 2018.   ?Pancreatitis: never. 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    ?Other: he has done poorly on several med regimens; ins declined Trulicity.   ?Interval history: pt states he feels well in general.  I reviewed continuous glucose monitor data.  glucose varies from 100-320.  It is in general highest at 12N-7PM.  It is in general lowest at 1AM-6AM.  It is flat for most of the day, and also overnight.  He denies hypoglycemia.   ?Past Medical History:  ?Diagnosis Date  ? Abscess of left hand 09/12/2015  ? Diabetes mellitus without complication (HCC)   ? Hypertension   ? ? ?Past Surgical History:  ?Procedure Laterality Date  ? I & D EXTREMITY Left 09/12/2015  ? Procedure: IRRIGATION AND DEBRIDEMENT LEFT RING FINGER ABSCESS;  Surgeon: Dominica Severin, MD;  Location: [facility] OR;  Service: Orthopedics;  Laterality: Left;  ? ORIF ULNAR FRACTURE Right 08/2008  ? ? ?Social History  ? ?Socioeconomic History  ? Marital status: Married  ?  Spouse name: [name]  ? Number of children: 0  ? Years of education: college  ? Highest education level: Not on file  ?Occupational History  ? Occupation: MLT  ?  Comment: [employer]  ?Tobacco Use  ? Smoking status: Never  ? Smokeless tobacco: Never  ?Substance and Sexual Activity  ? Alcohol use: Yes  ?  Alcohol/week: 2.0 standard drinks  ?  Types: 2 Standard drinks or equivalent per week  ? Drug use: No  ? Sexual activity: Yes  ?  Partners: Female  ?Other Topics Concern  ? Not on file  ?Social History Narrative  ? Graduate of [employer] with a degree in Biology.  ? Lives with his girlfriend, Lorane Gell.  ? Family lives in [place], [place].  ? ?Social Determinants of Health  ? ?Financial Resource Strain: Not on file   ?Food Insecurity: Not on file  ?Transportation Needs: Not on file  ?Physical Activity: Not on file  ?Stress: Not on file  ?Social Connections: Not on file  ?Intimate Partner Violence: Not on file  ? ? ?Current Outpatient Medications on File Prior to Visit  ?Medication Sig Dispense Refill  ? Continuous Blood Gluc Sensor (DEXCOM G6 SENSOR) MISC 1 Device by Does not apply route See admin instructions. Change every 10 days 9 each 3  ? Continuous Blood Gluc Transmit (DEXCOM G6 TRANSMITTER) MISC Use as directed to check BS 1 each 0  ? insulin aspart (NOVOLOG FLEXPEN) 100 UNIT/ML FlexPen For PRN use, total of 30 units per day 30 mL 1  ? Insulin Glargine (BASAGLAR KWIKPEN) 100 UNIT/ML Inject 50 Units into the skin every morning. 60 mL 3  ? Insulin Pen Needle (PEN NEEDLES) 30G X 8 MM MISC 1 each by Does not apply route daily. E11.9 90 each 0  ? ?No current facility-administered medications on file prior to visit.  ? ? ?Allergies  ?Allergen Reactions  ? Penicillins   ?  Just didn't work ?Has patient had a PCN reaction causing immediate rash, facial/tongue/throat swelling, SOB or lightheadedness with hypotension: unknown ?Has patient had a PCN reaction causing severe rash involving mucus membranes or skin necrosis: unknown ?Has patient had a PCN  reaction that required hospitalization: Unknown ?Has patient had a PCN reaction occurring within the last 10 years: Unknown ?If all of the above answers are "NO", then may proceed with Cephalosporin use. ?  ? ? ?Family History  ?Problem Relation Age of Onset  ? Diabetes Mother   ? Hypertension Mother   ? Cancer Father 83  ?     colon  ? Hypertension Father   ? ? ?BP 134/84   Ht 6' (1.829 m)   Wt 285 lb (129.3 kg)   BMI 38.65 kg/m?  ? ? ?Review of Systems ?Denies N/HB.   ?   ?Objective:  ? Physical Exam ?VITAL SIGNS:  See vs page ?GENERAL: no distress ?Pulses: dorsalis pedis intact bilat.   ?MSK: no deformity of the feet ?CV: no leg edema ?Skin:  no ulcer on the feet.  normal color  and temp on the feet. ?Neuro: sensation is intact to touch on the feet.   ? ? ?outside test results are reviewed: ?A1C=8.7 ?   ?Assessment & Plan:  ?Insulin-requiring type 2 DM: uncontrolled ? ?Patient Instructions  ?check your blood sugar twice a day.  vary the time of day when you check, between before the 3 meals, and at bedtime.  also check if you have symptoms of your blood sugar being too high or too low.  please keep a record of the readings and bring it to your next appointment here.  You can write it on any piece of paper.  please call us sooner if your blood sugar goes below 70, or if you have a lot of readings over 200.  ?On this type of insulin schedule, you should eat meals on a regular schedule.  If a meal is missed or significantly delayed, your blood sugar could go low.   ?I have sent a prescription to your pharmacy, to double the Trulicity again.   ?Please continue the same insulin.   ?You should have a follow-up appointment in 3 months.   ? ? ?

## 2022-01-18 DIAGNOSIS — E108 Type 1 diabetes mellitus with unspecified complications: Secondary | ICD-10-CM | POA: Insufficient documentation

## 2022-01-18 DIAGNOSIS — E119 Type 2 diabetes mellitus without complications: Secondary | ICD-10-CM | POA: Insufficient documentation

## 2022-01-22 ENCOUNTER — Encounter: Payer: Self-pay | Admitting: Endocrinology

## 2022-02-02 ENCOUNTER — Other Ambulatory Visit: Payer: Self-pay | Admitting: Endocrinology

## 2022-02-02 DIAGNOSIS — E1165 Type 2 diabetes mellitus with hyperglycemia: Secondary | ICD-10-CM

## 2022-02-04 MED ORDER — DEXCOM G6 TRANSMITTER MISC: 0 refills | Status: DC

## 2022-02-20 ENCOUNTER — Encounter: Payer: Self-pay | Admitting: Endocrinology

## 2022-02-21 ENCOUNTER — Other Ambulatory Visit: Payer: Self-pay | Admitting: Endocrinology

## 2022-02-21 DIAGNOSIS — E1165 Type 2 diabetes mellitus with hyperglycemia: Secondary | ICD-10-CM

## 2022-02-26 ENCOUNTER — Other Ambulatory Visit: Payer: Self-pay

## 2022-02-26 DIAGNOSIS — E1165 Type 2 diabetes mellitus with hyperglycemia: Secondary | ICD-10-CM

## 2022-02-26 MED ORDER — TRULICITY 3 MG/0.5ML ~~LOC~~ SOAJ: 3.0000 mg | SUBCUTANEOUS | 0 refills | Status: DC

## 2022-04-21 ENCOUNTER — Ambulatory Visit
Admission: EM | Admit: 2022-04-21 | Discharge: 2022-04-21 | Disposition: A | Discharge status: Home / Self Care | Payer: 59 | Attending: Physician Assistant | Admitting: Physician Assistant

## 2022-04-21 DIAGNOSIS — R519 Headache, unspecified: Secondary | ICD-10-CM | POA: Insufficient documentation

## 2022-04-21 DIAGNOSIS — R112 Nausea with vomiting, unspecified: Secondary | ICD-10-CM | POA: Insufficient documentation

## 2022-04-21 DIAGNOSIS — R0981 Nasal congestion: Secondary | ICD-10-CM | POA: Insufficient documentation

## 2022-04-21 DIAGNOSIS — I10 Essential (primary) hypertension: Secondary | ICD-10-CM | POA: Insufficient documentation

## 2022-04-21 DIAGNOSIS — Z20822 Contact with and (suspected) exposure to covid-19: Secondary | ICD-10-CM | POA: Diagnosis not present

## 2022-04-21 DIAGNOSIS — E119 Type 2 diabetes mellitus without complications: Secondary | ICD-10-CM | POA: Insufficient documentation

## 2022-04-21 DIAGNOSIS — B349 Viral infection, unspecified: Secondary | ICD-10-CM | POA: Insufficient documentation

## 2022-04-21 DIAGNOSIS — Z794 Long term (current) use of insulin: Secondary | ICD-10-CM | POA: Insufficient documentation

## 2022-04-21 LAB — RESP PANEL BY RT-PCR (FLU A&B, COVID) ARPGX2
Influenza A by PCR: NEGATIVE
Influenza B by PCR: NEGATIVE
SARS Coronavirus 2 by RT PCR: NEGATIVE

## 2022-04-21 MED ORDER — ONDANSETRON 4 MG PO TBDP
4.0000 mg | ORAL_TABLET | Freq: Three times a day (TID) | ORAL | 0 refills | Status: AC | PRN
Start: 2022-04-21 — End: 2022-04-24

## 2022-04-21 NOTE — ED Triage Notes (Signed)
Pt states that he woke up and vomited.  Pt has been having headaches, nasal congestion, fatigue, body aches x2-3days  Pt denies any abdominal pain and diarrhea  Pt was around a coworker who had bronchitis.   Pt is not worried about covid or flu

## 2022-04-21 NOTE — Discharge Instructions (Addendum)
-  We will call if you are positive for COVID or flu.  If you have COVID you need to isolate 5 days and wear mask for 5 days. - You likely have another virus.  I have sent nausea medication for you.  Increase your rest and fluid intake. - If you have any uncontrollable fever, weakness, abdominal pain, severe worsening cough, breathing difficulty you should be seen again and reexamined. - She should be feeling better in the next couple of days.

## 2022-04-21 NOTE — ED Provider Notes (Signed)
[facility]    CSN: [id number] Arrival date & time: 04/21/22  1102      History   Chief Complaint Chief Complaint  Patient presents with   Generalized Body Aches   Headache   Nasal Congestion    HPI Daniel Massey is a 31 y.o. male presenting for onset of headaches, nasal congestion, fatigue, body aches nausea yesterday.  Reports that he had an episode of vomiting yesterday.  And continues to feel nauseous.  He denies fever, cough, sore throat, chest pain, breathing difficulty, abdominal pain or diarrhea.  He reports that a coworker is sick with a cough.  He has not been taking any over-the-counter medicine for symptoms.  Patient has history of diabetes and hypertension.  No other complaints.  HPI  Past Medical History:  Diagnosis Date   Abscess of left hand 09/12/2015   Diabetes mellitus without complication [facility])    Hypertension     Patient Active Problem List   Diagnosis Date Noted   Diabetes (HCC) 01/18/2022   Obesity, Class III, BMI 40-49.9 (morbid obesity) (HCC) 09/04/2014   Essential hypertension 09/04/2014    Past Surgical History:  Procedure Laterality Date   I & D EXTREMITY Left 09/12/2015   Procedure: IRRIGATION AND DEBRIDEMENT LEFT RING FINGER ABSCESS;  Surgeon: Dominica Severin, MD;  Location: [facility] OR;  Service: Orthopedics;  Laterality: Left;   ORIF ULNAR FRACTURE Right 08/2008       Home Medications    Prior to Admission medications   Medication Sig Start Date End Date Taking? Authorizing Provider  amlodipine-olmesartan (AZOR) 10-20 MG tablet Take 1 tablet by mouth daily. 04/04/22  Yes [provider]  Continuous Blood Gluc Sensor (DEXCOM G6 SENSOR) MISC 1 Device by Does not apply route See admin instructions. Change every 10 days 11/30/21  Yes Romero Belling, MD  Continuous Blood Gluc Transmit (DEXCOM G6 TRANSMITTER) MISC Use as directed to check BS 02/04/22  Yes Romero Belling, MD  Dulaglutide (TRULICITY) 3 MG/0.5ML SOPN Inject 3 mg  as directed once a week. 02/26/22  Yes Reather Littler, MD  insulin aspart (NOVOLOG FLEXPEN) 100 UNIT/ML FlexPen FOR AS NEEDED USE, TOTAL OF 30 UNITS PER DAY 02/21/22  Yes Shamleffer, Konrad Dolores, MD  Insulin Glargine (BASAGLAR KWIKPEN) 100 UNIT/ML Inject 50 Units into the skin every morning. 08/10/21  Yes Romero Belling, MD  Insulin Pen Needle (PEN NEEDLES) 30G X 8 MM MISC 1 each by Does not apply route daily. E11.9 02/20/21  Yes Romero Belling, MD  ondansetron (ZOFRAN-ODT) 4 MG disintegrating tablet Take 1 tablet (4 mg total) by mouth every 8 (eight) hours as needed for up to 3 days for nausea or vomiting. 04/21/22 04/24/22 Yes Shirlee Latch, PA-C    Family History Family History  Problem Relation Age of Onset   Diabetes Mother    Hypertension Mother    Cancer Father 36       colon   Hypertension Father     Social History Social History   Tobacco Use   Smoking status: Never   Smokeless tobacco: Never  Vaping Use   Vaping Use: Never used  Substance Use Topics   Alcohol use: Yes    Alcohol/week: 2.0 standard drinks of alcohol    Types: 2 Standard drinks or equivalent per week   Drug use: No     Allergies   Penicillins   Review of Systems Review of Systems  Constitutional:  Positive for fatigue. Negative for fever.  HENT:  Positive for  congestion. Negative for rhinorrhea, sinus pressure, sinus pain and sore throat.   Respiratory:  Negative for cough and shortness of breath.   Cardiovascular:  Negative for chest pain.  Gastrointestinal:  Positive for nausea and vomiting. Negative for abdominal pain and diarrhea.  Musculoskeletal:  Positive for myalgias.  Neurological:  Positive for headaches. Negative for weakness and light-headedness.  Hematological:  Negative for adenopathy.     Physical Exam Triage Vital Signs ED Triage Vitals  Enc Vitals Group     BP      Pulse      Resp      Temp      Temp src      SpO2      Weight      Height      Head Circumference       Peak Flow      Pain Score      Pain Loc      Pain Edu?      Excl. in GC?    No data found.  Updated Vital Signs BP (!) 127/93 (BP Location: Left Arm)   Pulse 100   Temp 99.6 F (37.6 C) (Oral)   Resp 18   Ht 6' (1.829 m)   Wt 280 lb (127 kg)   SpO2 98%   BMI 37.97 kg/m    Physical Exam Vitals and nursing note reviewed.  Constitutional:      General: He is not in acute distress.    Appearance: Normal appearance. He is well-developed. He is ill-appearing.  HENT:     Head: Normocephalic and atraumatic.     Nose: Congestion present.     Mouth/Throat:     Mouth: Mucous membranes are moist.     Pharynx: Oropharynx is clear.  Eyes:     General: No scleral icterus.    Conjunctiva/sclera: Conjunctivae normal.  Cardiovascular:     Rate and Rhythm: Normal rate and regular rhythm.     Heart sounds: Normal heart sounds.  Pulmonary:     Effort: Pulmonary effort is normal. No respiratory distress.     Breath sounds: Normal breath sounds.  Abdominal:     Palpations: Abdomen is soft.     Tenderness: There is no abdominal tenderness.  Musculoskeletal:     Cervical back: Neck supple.  Skin:    General: Skin is warm and dry.     Capillary Refill: Capillary refill takes less than 2 seconds.  Neurological:     General: No focal deficit present.     Mental Status: He is alert. Mental status is at baseline.     Motor: No weakness.     Coordination: Coordination normal.     Gait: Gait normal.  Psychiatric:        Mood and Affect: Mood normal.        Behavior: Behavior normal.      UC Treatments / Results  Labs (all labs ordered are listed, but only abnormal results are displayed) Labs Reviewed  RESP PANEL BY RT-PCR (FLU A&B, COVID) [id number]    EKG   Radiology No results found.  Procedures Procedures (including critical care time)  Medications Ordered in UC Medications - No data to display  Initial Impression / Assessment and Plan / UC Course  I have reviewed the  triage vital signs and the nursing notes.  Pertinent labs & imaging results that were available during my care of the patient were reviewed by me and considered in my medical decision  making (see chart for details).  31 year old male presenting for onset of fatigue, headaches, nausea with an episode of vomiting, congestion yesterday.  No fever, cough, sore throat, abdominal pain, diarrhea.  Exposed to a coworker who has a cough.  Patient is afebrile.  He is mildly ill-appearing but nontoxic.  On exam he has nasal congestion, otherwise normal exam.  Abdomen soft and nontender.  Chest clear to auscultation heart regular rate rhythm.  Respiratory panel obtained to assess for influenza and COVID-19.  Advised patient we will call him with the results if positive.  Advised patient symptoms likely due to viral illness.  Supportive care encouraged.  Sent Zofran to pharmacy.  Advised bland diet and plenty of fluids.  Reviewed going to the ER if he develops a fever, abdominal pain or worsening symptoms of any sort.  Patient provided with work note.  Negative respiratory panel.  Final Clinical Impressions(s) / UC Diagnoses   Final diagnoses:  Viral illness  Nausea and vomiting, unspecified vomiting type  Nasal congestion     Discharge Instructions      -We will call if you are positive for COVID or flu.  If you have COVID you need to isolate 5 days and wear mask for 5 days. - You likely have another virus.  I have sent nausea medication for you.  Increase your rest and fluid intake. - If you have any uncontrollable fever, weakness, abdominal pain, severe worsening cough, breathing difficulty you should be seen again and reexamined. - She should be feeling better in the next couple of days.     ED Prescriptions     Medication Sig Dispense Auth. Provider   ondansetron (ZOFRAN-ODT) 4 MG disintegrating tablet Take 1 tablet (4 mg total) by mouth every 8 (eight) hours as needed for up to 3 days  for nausea or vomiting. 9 tablet Gareth Morgan      PDMP not reviewed this encounter.   Shirlee Latch, PA-C 04/21/22 1324

## 2022-05-15 ENCOUNTER — Ambulatory Visit: Payer: 59 | Admitting: Endocrinology

## 2022-05-30 ENCOUNTER — Other Ambulatory Visit: Payer: Self-pay | Admitting: Endocrinology

## 2022-05-30 DIAGNOSIS — E1165 Type 2 diabetes mellitus with hyperglycemia: Secondary | ICD-10-CM

## 2022-05-30 MED ORDER — DEXCOM G6 TRANSMITTER MISC: 0 refills | Status: DC

## 2022-08-02 ENCOUNTER — Telehealth: Payer: Self-pay | Admitting: Endocrinology

## 2022-08-02 DIAGNOSIS — E1165 Type 2 diabetes mellitus with hyperglycemia: Secondary | ICD-10-CM

## 2022-08-02 NOTE — Telephone Encounter (Signed)
MEDICATION: "Dexcom G6 Receiver" per patient  PHARMACY:  [id number] - [place], [place] (Ph: [phone])  HAS THE PATIENT CONTACTED THEIR PHARMACY?  No  IS THIS A 90 DAY SUPPLY :   IS PATIENT OUT OF MEDICATION: Yes-his is broken  IF NOT; HOW MUCH IS LEFT:   LAST APPOINTMENT DATE: @4 /03/2022  NEXT APPOINTMENT DATE:@2 /06/2023  DO WE HAVE YOUR PERMISSION TO LEAVE A DETAILED MESSAGE?:Yes  OTHER COMMENTS: Patient states that if his appointment cannot be moved up and he cannot get a new receiver then he would like a referral to another endocrinologist.   **Let patient know to contact pharmacy at the end of the day to make sure medication is ready. **  ** Please notify patient to allow 48-72 hours to process**  **Encourage patient to contact the pharmacy for refills or they can request refills through [facility]**

## 2022-08-05 MED ORDER — DEXCOM G6 RECEIVER DEVI: 0 refills | Status: DC

## 2022-08-05 NOTE — Telephone Encounter (Signed)
Rx sent to pharmacy   

## 2022-08-07 ENCOUNTER — Other Ambulatory Visit: Payer: Self-pay | Admitting: Endocrinology

## 2022-08-07 DIAGNOSIS — E1165 Type 2 diabetes mellitus with hyperglycemia: Secondary | ICD-10-CM

## 2022-08-08 MED ORDER — DEXCOM G6 TRANSMITTER MISC: 0 refills | Status: DC

## 2022-08-13 ENCOUNTER — Ambulatory Visit (INDEPENDENT_AMBULATORY_CARE_PROVIDER_SITE_OTHER): Payer: 59 | Admitting: Endocrinology

## 2022-08-13 ENCOUNTER — Encounter: Payer: Self-pay | Admitting: Endocrinology

## 2022-08-13 VITALS — BP 144/90 | HR 87 | Ht 72.0 in | Wt 282.0 lb

## 2022-08-13 DIAGNOSIS — E1065 Type 1 diabetes mellitus with hyperglycemia: Secondary | ICD-10-CM

## 2022-08-13 DIAGNOSIS — E1165 Type 2 diabetes mellitus with hyperglycemia: Secondary | ICD-10-CM

## 2022-08-13 LAB — POCT GLYCOSYLATED HEMOGLOBIN (HGB A1C): Hemoglobin A1C: 9.1 % — AB (ref 4.0–5.6)

## 2022-08-13 MED ORDER — DEXCOM G7 SENSOR MISC: 1.0000 | 3 refills | Status: DC

## 2022-08-13 NOTE — Patient Instructions (Addendum)
Lantus 40 on waking up and 20 at supper, only 10 on work nites  Adjust am insulin based on blood sugar before dinner  When going to day shifts you can try taking 30 units Lantus in the mornings on the workdays and 40 units on off days along with at least 20 units at suppertime  Calorie King to help with counting carbs  Meals:  based on meal size 10 or 15 or 20  2 hr target 180 after meal  Add correction dose smallest dose  Stop Trulicity

## 2022-08-13 NOTE — Progress Notes (Signed)
Patient ID: Doctor Sheahan, male   DOB: 06/20/1991, 31 y.o.   MRN: [id number]           Reason for Appointment:  Diabetes follow-up   History of Present Illness   Diagnosis date: 2011  Previous history:  Non-insulin hypoglycemic drugs previously used: Metformin, Trulicity since 10/22 Insulin was started in 2012 after being on metformin for about a year and lack of control  A1c range in the last few years is: 8-9.7  Recent history:     Non-insulin hypoglycemic drugs: Trulicity 3 mg weekly     Insulin regimen: Basaglar 50 units daily, NovoLog correction doses            Side effects from medications: None  Current self management, blood sugar patterns and problems identified:  A1c is 9.1, was 8.2 He takes his Basaglar insulin at different times from day-to-day 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 and has no consistent times to take it He has been given NovoLog but is only using a sliding scale he found online for correction doses, sometimes at mealtimes and other times when the sugars are high This regimen is not controlling his sugars with high variability in generally blood sugars only 31% in target range on his Dexcom Recently has had difficulty getting his Dexcom to work because of failure transmitter and data available only through 10/19 He thinks that his blood sugars go up significantly through the night when he takes his Basaglar in the morning He made cut down his Basaglar insulin to 40 units when he is working to avoid low sugars He says he is interested in insulin pump therapy Does not appear to have had better A1c with starting Trulicity and no significant weight loss, he does cut back his appetite some  Exercise: none  Diet management: Not following any particular diet     Hypoglycemia: Rarely around midday  CGM data interpretation for Dexcom G6 between 10/6 and 10/19 as follows  High variability is present with standard deviation 81 He has no consistent  patterns with highly variable blood sugars at all times especially overnight Has hyperglycemic episodes at all different times for various durations of time Difficult to identify and mealtime patterns as a mealtimes are unknown His average blood sugar is generally ranging between 180 and 250 at any given time Overnight blood sugars are also highly variable with hyperglycemia as well as some occasional blood sugars transiently in the target range May have low normal or slightly low readings in the early afternoon occasionally but no significant hypoglycemia  CGM use % of time 93  2-week average/GV 220/36  Time in range     31   %  % Time Above 180 33  % Time above 250 35  % Time Below 70 1       Dietician visit: Most recent:   Never  Weight control: Previous range 220-310  Wt Readings from Last 3 Encounters:  08/13/22 282 lb (127.9 kg)  04/21/22 280 lb (127 kg)  01/17/22 285 lb (129.3 kg)            Diabetes labs:  Lab Results  Component Value Date   HGBA1C 9.1 (A) 08/13/2022   HGBA1C 8.2 (A) 11/09/2021   HGBA1C 8.3 (A) 08/10/2021   Lab Results  Component Value Date   MICROALBUR 0.9 09/04/2014   LDLCALC 100 (H) 09/04/2014   CREATININE 2.36 (H) 09/16/2015    Lab Results  Component Value Date   FRUCTOSAMINE 417 (H)  08/29/2015     Allergies as of 08/13/2022       Reactions   Penicillins    Just didn't work Has patient had a PCN reaction causing immediate rash, facial/tongue/throat swelling, SOB or lightheadedness with hypotension: unknown Has patient had a PCN reaction causing severe rash involving mucus membranes or skin necrosis: unknown Has patient had a PCN reaction that required hospitalization: Unknown Has patient had a PCN reaction occurring within the last 10 years: Unknown If all of the above answers are "NO", then may proceed with Cephalosporin use.        Medication List        Accurate as of August 13, 2022 11:41 AM. If you have any  questions, ask your nurse or doctor.          amlodipine-olmesartan 10-20 MG tablet Commonly known as: AZOR Take 1 tablet by mouth daily.   Basaglar KwikPen 100 UNIT/ML Inject 50 Units into the skin every morning.   Dexcom G6 Receiver Devi Use to check blood sugar daily   Dexcom G6 Transmitter Misc Use as directed to check BS   Dexcom G7 Sensor Misc 1 Device by Does not apply route as directed. Change sensor every 10 days What changed:  when to take this additional instructions Changed by: Reather Littler, MD   NovoLOG FlexPen 100 UNIT/ML FlexPen Generic drug: insulin aspart FOR AS NEEDED USE, TOTAL OF 30 UNITS PER DAY   Pen Needles 30G X 8 MM Misc 1 each by Does not apply route daily. E11.9   Trulicity 3 MG/0.5ML Sopn Generic drug: Dulaglutide Inject 3 mg as directed once a week.        Allergies:  Allergies  Allergen Reactions   Penicillins     Just didn't work Has patient had a PCN reaction causing immediate rash, facial/tongue/throat swelling, SOB or lightheadedness with hypotension: unknown Has patient had a PCN reaction causing severe rash involving mucus membranes or skin necrosis: unknown Has patient had a PCN reaction that required hospitalization: Unknown Has patient had a PCN reaction occurring within the last 10 years: Unknown If all of the above answers are "NO", then may proceed with Cephalosporin use.     Past Medical History:  Diagnosis Date   Abscess of left hand 09/12/2015   Diabetes mellitus without complication (HCC)    Hypertension     Past Surgical History:  Procedure Laterality Date   I & D EXTREMITY Left 09/12/2015   Procedure: IRRIGATION AND DEBRIDEMENT LEFT RING FINGER ABSCESS;  Surgeon: Dominica Severin, MD;  Location: [facility] OR;  Service: Orthopedics;  Laterality: Left;   ORIF ULNAR FRACTURE Right 08/2008    Family History  Problem Relation Age of Onset   Diabetes Mother    Hypertension Mother    Cancer Father 57       colon    Hypertension Father    Diabetes Paternal Grandfather     Social History:  reports that he has never smoked. He has never used smokeless tobacco. He reports current alcohol use of about 2.0 standard drinks of alcohol per week. He reports that he does not use drugs.  Review of Systems:  Last diabetic eye exam date unknown  Last urine microalbumin date: Unknown  Last foot exam date: 3/22  Symptoms of neuropathy: None  Hypertension: Managed by his PCP ACE/ARB medication: Olmesartan 20 mg  BP Readings from Last 3 Encounters:  08/13/22 (!) 144/90  04/21/22 (!) 127/93  01/17/22 134/84    Lipid management:  Not on statins, he thinks his HDL is low but has not been told about high cholesterol by his PCP, no reports available    Lab Results  Component Value Date   CHOL 179 09/04/2014   Lab Results  Component Value Date   HDL 59 09/04/2014   Lab Results  Component Value Date   LDLCALC 100 (H) 09/04/2014   Lab Results  Component Value Date   TRIG 98 09/04/2014   Lab Results  Component Value Date   CHOLHDL 3.0 09/04/2014   No results found for: "LDLDIRECT"         Examination:   BP (!) 144/90   Pulse 87   Ht 6' (1.829 m)   Wt 282 lb (127.9 kg)   SpO2 99%   BMI 38.25 kg/m   Body mass index is 38.25 kg/m.    ASSESSMENT/ PLAN:    Diabetes with obesity, insulin-dependent:   Current regimen: Basaglar insulin, NovoLog correction doses  See history of present illness for detailed discussion of current diabetes management, blood sugar patterns and problems identified  A1c is 9.1 and has been consistently over 8% in the last few years  He appears to be insulin-dependent and likely insulin deficient also especially with family history of type 1 diabetes Currently his diabetes management is not structured with inconsistent time for taking his basal insulin which is also not apparent in the last 24 hours Also do not have any mealtime coverage with analog insulin and  only taking correction doses but currently Has not had any significant diabetes education  Has been using the Dexcom sensor but has difficulty getting the Dexcom 6 to work because of technical problems with his transmitter  Recommendations:  Offered him to switch to Dexcom 7 and he agrees to do that for simplicity even though he has some sensors for Dexcom 6 ago Since he has a large supply of Lantus he will continue this for now but start taking Lantus twice a day with the following regimen Discussed need for taking this twice a day for 24 hour coverage Also needs to adjust the doses based on blood sugars either fasting or before dinner as follows  Lantus 40 on waking up and 20 at supper, only 10 on work nites  Adjust am insulin based on blood sugar before dinner  When going to day shifts you can try taking 30 units Lantus in the mornings on the workdays and 40 units on off days along with at least 20 units at suppertime  Calorie King to help with counting carbs  Mealtime insulin with NovoLog:  based on meal size, 10 units for small or 15 units for average or 20 minutes, large meals  2 hr target for glucose = 180 after meal  Add correction dose to the mealtime dose smallest dose  Stop Trulicity at this is likely ineffective  He is interested in insulin pump and discussed principles of insulin pump therapy including availability of various options including OmniPod tubeless pump and 3 other pumps with have closed-loop algorithm is available Given brochures on all the pumps including the Ilet, Medtronic 780, T-slim  He will review the information on the blood sugar also sent down with the diabetes educator to review these options as well as follow-up to see his progress with basal bolus insulin regimen  LABS: He needs urine microalbumin, chemistry panel and he will request records from his PCP for other information relevant to his management  Needs annual eye exams  Patient  Instructions  Lantus 40 on waking up and 20 at supper, only 10 on work nites  Adjust am insulin based on blood sugar before dinner  When going to day shifts you can try taking 30 units Lantus in the mornings on the workdays and 40 units on off days along with at least 20 units at [place] to help with counting carbs  Meals:  based on meal size 10 or 15 or 20  2 hr target 180 after meal  Add correction dose smallest dose  Stop Trulicity   Total visit time for evaluation and management, review of all relevant records, counseling = 40 minutes   Elayne Snare 08/13/2022, 11:41 AM

## 2022-08-14 ENCOUNTER — Other Ambulatory Visit (INDEPENDENT_AMBULATORY_CARE_PROVIDER_SITE_OTHER): Payer: 59

## 2022-08-14 DIAGNOSIS — E1165 Type 2 diabetes mellitus with hyperglycemia: Secondary | ICD-10-CM

## 2022-08-14 LAB — COMPREHENSIVE METABOLIC PANEL
ALT: 22 U/L (ref 0–53)
AST: 26 U/L (ref 0–37)
Albumin: 4.3 g/dL (ref 3.5–5.2)
Alkaline Phosphatase: 73 U/L (ref 39–117)
BUN: 15 mg/dL (ref 6–23)
CO2: 27 mEq/L (ref 19–32)
Calcium: 9.3 mg/dL (ref 8.4–10.5)
Chloride: 102 mEq/L (ref 96–112)
Creatinine, Ser: 0.82 mg/dL (ref 0.40–1.50)
GFR: 117.09 mL/min (ref >60.00)
Glucose, Bld: 151 mg/dL — ABNORMAL HIGH (ref 70–99)
Potassium: 3.7 mEq/L (ref 3.5–5.1)
Sodium: 137 mEq/L (ref 135–145)
Total Bilirubin: 0.7 mg/dL (ref 0.2–1.2)
Total Protein: 7.2 g/dL (ref 6.0–8.3)

## 2022-08-14 LAB — MICROALBUMIN / CREATININE URINE RATIO
Creatinine,U: 226.6 mg/dL
Microalb Creat Ratio: 0.7 mg/g (ref 0.0–30.0)
Microalb, Ur: 1.6 mg/dL (ref 0.0–1.9)

## 2022-08-16 ENCOUNTER — Encounter: Payer: Self-pay | Admitting: Endocrinology

## 2022-09-09 ENCOUNTER — Other Ambulatory Visit: Payer: Self-pay | Admitting: Internal Medicine

## 2022-09-09 DIAGNOSIS — E1165 Type 2 diabetes mellitus with hyperglycemia: Secondary | ICD-10-CM

## 2022-10-15 ENCOUNTER — Encounter: Payer: Self-pay | Admitting: Endocrinology

## 2022-10-15 DIAGNOSIS — E1165 Type 2 diabetes mellitus with hyperglycemia: Secondary | ICD-10-CM

## 2022-10-16 MED ORDER — LANTUS SOLOSTAR 100 UNIT/ML ~~LOC~~ SOPN: PEN_INJECTOR | SUBCUTANEOUS | 3 refills | Status: DC

## 2022-11-19 ENCOUNTER — Other Ambulatory Visit: Payer: 59

## 2022-11-22 ENCOUNTER — Ambulatory Visit: Payer: 59 | Admitting: Endocrinology

## 2022-11-29 ENCOUNTER — Ambulatory Visit (INDEPENDENT_AMBULATORY_CARE_PROVIDER_SITE_OTHER): Payer: 59 | Admitting: Endocrinology

## 2022-11-29 ENCOUNTER — Encounter: Payer: Self-pay | Admitting: Endocrinology

## 2022-11-29 VITALS — BP 118/80 | HR 93 | Ht 72.0 in | Wt 272.8 lb

## 2022-11-29 DIAGNOSIS — E1065 Type 1 diabetes mellitus with hyperglycemia: Secondary | ICD-10-CM

## 2022-11-29 MED ORDER — OMNIPOD 5 DEXG7G6 INTRO GEN 5 KIT: 1.0000 | PACK | Freq: Once | 0 refills | Status: AC

## 2022-11-29 MED ORDER — OMNIPOD 5 DEXG7G6 PODS GEN 5 MISC: 1.0000 | 3 refills | Status: DC

## 2022-11-29 NOTE — Patient Instructions (Signed)
Lantus 45 in am and 15 in pm  Adjust pm dose to bring am sugar to <140  15 Novolog at start of meal and add 2-4 for hi sugars

## 2022-11-29 NOTE — Progress Notes (Signed)
Patient ID: Daniel Massey, male   DOB: [birth year], 32 y.o.   MRN: [id number]           Reason for Appointment:  Diabetes follow-up   History of Present Illness   Diagnosis date: 2011  Previous history:  Non-insulin hypoglycemic drugs previously used: Metformin, Trulicity since [id number] Insulin was started in 2012 after being on metformin for about a year and lack of control  A1c range in the last few years is: 8-9.7  Recent history:     Non-insulin hypoglycemic drugs: Trulicity 3 mg weekly     Insulin regimen: Basaglar 50 units daily, NovoLog 10 units before meals           Side effects from medications: None  Current self management, blood sugar patterns and problems identified:  A1c is 9.1, again He takes his Basaglar insulin in the morning although on the last visit was told to split it up He has markedly increased blood sugars at all times and only occasionally will have some improvements likely from taking NovoLog either additionally or late after meals May not always take NovoLog at the start of the meal especially while at work Not clear if he is benefiting from [employer] which he is taking regularly He was told to look over different types of insulin pumps and he did not call back although he thinks he would prefer the OmniPod without the tubing   Diet management: Not following any particular diet      CGM data interpretation for Dexcom 7 for 2 weeks ending 2/16   High variability is present with standard deviation 77 OVERNIGHT blood sugars are significantly variable but generally high especially in the first part of the night and only occasionally declining transiently in the middle of the night without hypoglycemia Premeal blood sugars are consistently running high at lunch and dinner Postprandial readings after lunch are frequently spiking higher with AVERAGE 271 After dinner blood sugars averaging 277 Only rarely blood sugars may be low normal before lunch or  dinner No hypoglycemia CGM use % of time   2-week average/GV 232/33  Time in range      24  % was 31  % Time Above 180 37  % Time above 250 39  % Time Below 70      Previously:  CGM use % of time 93  2-week average/GV 220/36  Time in range     31   %  % Time Above 180 33  % Time above 250 35  % Time Below 70 1      Dietician visit: Most recent:   Never  Weight control: Previous range 220-310  Wt Readings from Last 3 Encounters:  11/29/22 272 lb 12.8 oz (123.7 kg)  08/13/22 282 lb (127.9 kg)  04/21/22 280 lb (127 kg)            Diabetes labs:  Lab Results  Component Value Date   HGBA1C 9.1 (A) 08/13/2022   HGBA1C 8.2 (A) 11/09/2021   HGBA1C 8.3 (A) 08/10/2021   Lab Results  Component Value Date   MICROALBUR 1.6 08/14/2022   LDLCALC 100 (H) 09/04/2014   CREATININE 0.82 08/14/2022    Lab Results  Component Value Date   FRUCTOSAMINE 417 (H) 08/29/2015     Allergies as of 11/29/2022       Reactions   Penicillins    Just didn't work Has patient had a PCN reaction causing immediate rash, facial/tongue/throat swelling, SOB or lightheadedness with hypotension:  unknown Has patient had a PCN reaction causing severe rash involving mucus membranes or skin necrosis: unknown Has patient had a PCN reaction that required hospitalization: Unknown Has patient had a PCN reaction occurring within the last 10 years: Unknown If all of the above answers are "NO", then may proceed with Cephalosporin use.        Medication List        Accurate as of November 29, 2022  9:02 PM. If you have any questions, ask your nurse or doctor.          amlodipine-olmesartan 10-20 MG tablet Commonly known as: AZOR Take 1 tablet by mouth daily.   Dexcom G6 Receiver Devi Use to check blood sugar daily   Dexcom G6 Transmitter Misc Use as directed to check BS   Dexcom G7 Sensor Misc 1 Device by Does not apply route as directed. Change sensor every 10 days   Lantus SoloStar  100 UNIT/ML Solostar Pen Generic drug: insulin glargine Inject 50 units every morning   NovoLOG FlexPen 100 UNIT/ML FlexPen Generic drug: insulin aspart FOR AS NEEDED USE, TOTAL OF 30 UNITS PER DAY   Omnipod 5 G6 Intro (Gen 5) Kit 1 kit by Does not apply route once for 1 dose. Started by: Elayne Snare, MD   Omnipod 5 G6 Pods (Gen 5) Misc 1 Device by Does not apply route every 3 (three) days. Started by: Elayne Snare, MD   Pen Needles 30G X 8 MM Misc 1 each by Does not apply route daily. [id number]   Trulicity 3 0000000 Sopn Generic drug: Dulaglutide Inject 3 mg as directed once a week.        Allergies:  Allergies  Allergen Reactions   Penicillins     Just didn't work Has patient had a PCN reaction causing immediate rash, facial/tongue/throat swelling, SOB or lightheadedness with hypotension: unknown Has patient had a PCN reaction causing severe rash involving mucus membranes or skin necrosis: unknown Has patient had a PCN reaction that required hospitalization: Unknown Has patient had a PCN reaction occurring within the last 10 years: Unknown If all of the above answers are "NO", then may proceed with Cephalosporin use.     Past Medical History:  Diagnosis Date   Abscess of left hand 09/12/2015   Diabetes mellitus without complication ([place])    Hypertension     Past Surgical History:  Procedure Laterality Date   I & D EXTREMITY Left 09/12/2015   Procedure: IRRIGATION AND DEBRIDEMENT LEFT RING FINGER ABSCESS;  Surgeon: Roseanne Kaufman, MD;  Location: [facility];  Service: Orthopedics;  Laterality: Left;   ORIF ULNAR FRACTURE Right 08/2008    Family History  Problem Relation Age of Onset   Diabetes Mother    Hypertension Mother    Cancer Father 72       colon   Hypertension Father    Diabetes Paternal Grandfather     Social History:  reports that he has never smoked. He has never used smokeless tobacco. He reports current alcohol use of about 2.0 standard drinks of  alcohol per week. He reports that he does not use drugs.  Review of Systems:  Last diabetic eye exam date unknown  Last urine microalbumin date: Unknown  Last foot exam date: 3/22  Symptoms of neuropathy: None  Hypertension: Managed by his PCP ACE/ARB medication: Olmesartan 20 mg  BP Readings from Last 3 Encounters:  11/29/22 118/80  08/13/22 (!) 144/90  04/21/22 (!) 127/93    Lipid management: Not  on statins, he thinks his HDL is low but has not been told about high cholesterol by his PCP, no reports available    Lab Results  Component Value Date   CHOL 179 09/04/2014   Lab Results  Component Value Date   HDL 59 09/04/2014   Lab Results  Component Value Date   LDLCALC 100 (H) 09/04/2014   Lab Results  Component Value Date   TRIG 98 09/04/2014   Lab Results  Component Value Date   CHOLHDL 3.0 09/04/2014   No results found for: "LDLDIRECT"         Examination:   BP 118/80 (BP Location: Left Arm, Patient Position: Sitting, Cuff Size: Normal)   Pulse 93   Ht 6' (1.829 m)   Wt 272 lb 12.8 oz (123.7 kg)   SpO2 98%   BMI 37.00 kg/m   Body mass index is 37 kg/m.    ASSESSMENT/ PLAN:    Diabetes with obesity, insulin-dependent:   Current regimen: Basaglar insulin, NovoLog usually 10 units  See history of present illness for detailed discussion of current diabetes management, blood sugar patterns and problems identified  A1c is 9.1 again and has been consistently over 8% in the last few years  He appears to be both insulin deficient and insulin resistant and has not increase his insulin as directed His mealtime coverage is still quite inadequate although better in the last 7 days Also with his blood sugars averaging well over 200 overnight he needs much more basal insulin  Recommendations:  Will send the prescription for insulin pump with OmniPod as this is what he prefers and if this is covered he can set up the training for this In the meantime  he will take 45 Lantus in the morning and 15 in the evening  Mealtime insulin with NovoLog to be increased consistently:  based on meal size, 15 units for usual meals or 20 minutes, higher carbohydrate or eating out/high-fat meals  2 hr after meal target for glucose = 180 less  Add correction dose to the mealtime dose, at least 2 to 4 units  Needs to schedule eye exam, reminded again  Patient Instructions  Lantus 45 in am and 15 in pm  Adjust pm dose to bring am sugar to <140  15 Novolog at start of meal and add 2-4 for hi sugars   Total visit time for evaluation and management and counseling = 30 minutes  Elayne Snare 11/29/2022, 9:02 PM

## 2022-12-04 ENCOUNTER — Other Ambulatory Visit: Payer: Self-pay | Admitting: Endocrinology

## 2022-12-04 DIAGNOSIS — E1165 Type 2 diabetes mellitus with hyperglycemia: Secondary | ICD-10-CM

## 2022-12-04 MED ORDER — DEXCOM G6 TRANSMITTER MISC: 0 refills | Status: DC

## 2022-12-05 ENCOUNTER — Telehealth: Payer: Self-pay | Admitting: Dietician

## 2022-12-05 ENCOUNTER — Encounter: Payer: Self-pay | Admitting: Endocrinology

## 2022-12-05 DIAGNOSIS — E1065 Type 1 diabetes mellitus with hyperglycemia: Secondary | ICD-10-CM

## 2022-12-05 MED ORDER — DEXCOM G6 SENSOR MISC: 3 refills | Status: DC

## 2022-12-05 NOTE — Telephone Encounter (Signed)
Returned patient call.  He states that he has the omnipod 5 insulin pump and needs an appointment for training. Patient was not available. Message left for [facility], pump trainer to call him next week to arrange the appointment if he has been unable to contact us earlier to arrange.  Antonieta Iba, RD, LDN, CDCES

## 2022-12-10 NOTE — Telephone Encounter (Signed)
Appointment scheduled for 3/11 for pump training

## 2022-12-13 ENCOUNTER — Encounter: Payer: Self-pay | Admitting: Endocrinology

## 2022-12-23 ENCOUNTER — Encounter: Payer: 59 | Attending: Endocrinology | Admitting: Nutrition

## 2022-12-23 DIAGNOSIS — E1165 Type 2 diabetes mellitus with hyperglycemia: Secondary | ICD-10-CM | POA: Insufficient documentation

## 2022-12-24 NOTE — Patient Instructions (Signed)
Read over handouts given, starter booklet and pump manuel Call OmniPod if questions about pump usage Call Dexcom if questions about sensor usage Call office if blood sugars drop low or remain over 225.

## 2022-12-24 NOTE — Progress Notes (Signed)
Mr. Daniel Massey was trained on the use of the OmniPod 5 insulin pump.  Settings were put into the pump by the patient:  Basal rate: 1.2u/hr. ISF: 40, target 110 with correction over 130, timing 4 hours, I/C: 1 (pt is not carb counting. HE is taking 15u ac meals.  Says all meals are about the same size, and he does adjust the dose based on more/less carbs eaten. Suggested he may need less meal time coverage.  Says takes not meal time insulin while working, and yet blood sugars do come down acL and acS.  Discussed importance of doing this now and was trained on how to give a meal bolus as well as how/when to do correction boluses.  He re demonstrated these both correctly and reported good understanding of the need to do this.  His Dexcom was linked to [facility],and to his PDM. The PDM was linked to [facility] and to [place] [facility].   He filled a pod with Novolog insulin via his pens and was shown how to use the syringe to do this once he starts on the vials of Novolog.  He applied the pod to his left abdome and we discussed different pod placements with reference to CGM placement.   We reviewed sick day guidelines, high blood sugar protocols and supplies need to be carried with him at all times.  He was given a pump handout with all of the above printed on it.  He was encouraged to read over the starter booklet, handouts given and manuel on line. He placed the pod in automated mode and we discussed how this will work and IOB, what this means and how to use this to determine if blood sugars may drop low or if he needs a correction dose.  He reported good understanding of this and had no final questions.

## 2022-12-26 ENCOUNTER — Ambulatory Visit (INDEPENDENT_AMBULATORY_CARE_PROVIDER_SITE_OTHER): Payer: 59 | Admitting: Endocrinology

## 2022-12-26 ENCOUNTER — Encounter: Payer: Self-pay | Admitting: Endocrinology

## 2022-12-26 VITALS — BP 124/84 | HR 83 | Ht 72.0 in | Wt 285.6 lb

## 2022-12-26 DIAGNOSIS — E1065 Type 1 diabetes mellitus with hyperglycemia: Secondary | ICD-10-CM

## 2022-12-26 NOTE — Progress Notes (Signed)
Patient ID: Daniel Massey, male   DOB: November 18, 1990, 32 y.o.   MRN: [id number]           Reason for Appointment:  Diabetes follow-up   History of Present Illness   Diagnosis date: 2011  Previous history:  Non-insulin hypoglycemic drugs previously used: Metformin, Trulicity since [id number] Insulin was started in 2012 after being on metformin for about a year and lack of control  A1c range in the last few years is: 8-9.7  Recent history:     Non-insulin hypoglycemic drugs: Trulicity 3 mg weekly     Previous insulin regimen: Basaglar 50 units daily, NovoLog 10 units before meals         OMNIPOD settings: Basal rate: 1.2u/hr. ISF: 40, target 110 with correction over 130, timing 4 hours, I/C 1: 1 (pt is not carb counting.   Side effects from medications: None  Current self management, blood sugar patterns and problems identified:  A1c is 9.1 last He was started on the OmniPod insulin pump on 12/23/2022 because of poor control with Basaglar and NovoLog He takes his boluses and currently based on his portions and has been taking 3 to 6 units in the morning and up to 15 units later in the day Appears however that he has not consistently done boluses before every meal and occasionally may be late Blood sugars tend to be higher when he is not in the automated mode and he is not consistent with keeping it active Even with 15 units of boluses he does not tend to get hypoglycemia but currently postprandial readings appear to be quite variable Overnight blood sugars are recently very consistently close to target with some tendency to low normal readings around 165 from 8 AM Overall he feels better with the pump and has had no difficulty using it His day-to-day management and bolus timing for different meals and types of foods was discussed Overall control is much better with time in range 53% in the last 2 weeks compared to 24% previously even with the pump being active only for the last 5  days Currently not exercising   Diet management: Not following any particular diet  Recent Dexcom data:  CGM use % of time   2-week average/GV 181/37.8  Time in range 53  % Time Above 180 30  % Time above 250 16  % Time Below 70 1       Previous CGM interpretation and data:  High variability is present with standard deviation 77 OVERNIGHT blood sugars are significantly variable but generally high especially in the first part of the night and only occasionally declining transiently in the middle of the night without hypoglycemia Premeal blood sugars are consistently running high at lunch and dinner Postprandial readings after lunch are frequently spiking higher with AVERAGE 271 After dinner blood sugars averaging 277 Only rarely blood sugars may be low normal before lunch or dinner No hypoglycemia  CGM use % of time   2-week average/GV 232/33  Time in range      24  % was 31  % Time Above 180 37  % Time above 250 39  % Time Below 70    Dietician visit: Most recent:   Never  Weight control: Previous range 220-310  Wt Readings from Last 3 Encounters:  12/26/22 285 lb 9.6 oz (129.5 kg)  11/29/22 272 lb 12.8 oz (123.7 kg)  08/13/22 282 lb (127.9 kg)            Diabetes  labs:  Lab Results  Component Value Date   HGBA1C 9.1 (A) 08/13/2022   HGBA1C 8.2 (A) 11/09/2021   HGBA1C 8.3 (A) 08/10/2021   Lab Results  Component Value Date   MICROALBUR 1.6 08/14/2022   LDLCALC 100 (H) 09/04/2014   CREATININE 0.82 08/14/2022    Lab Results  Component Value Date   FRUCTOSAMINE 417 (H) 08/29/2015     Allergies as of 12/26/2022       Reactions   Penicillins    Just didn't work Has patient had a PCN reaction causing immediate rash, facial/tongue/throat swelling, SOB or lightheadedness with hypotension: unknown Has patient had a PCN reaction causing severe rash involving mucus membranes or skin necrosis: unknown Has patient had a PCN reaction that required  hospitalization: Unknown Has patient had a PCN reaction occurring within the last 10 years: Unknown If all of the above answers are "NO", then may proceed with Cephalosporin use.        Medication List        Accurate as of December 26, 2022 11:59 PM. If you have any questions, ask your nurse or doctor.          amlodipine-olmesartan 10-20 MG tablet Commonly known as: AZOR Take 1 tablet by mouth daily.   Dexcom G6 Receiver Devi Use to check blood sugar daily   Dexcom G6 Transmitter Misc Use as directed to check BS   Dexcom G7 Sensor Misc 1 Device by Does not apply route as directed. Change sensor every 10 days   Dexcom G6 Sensor Misc Use as instructed to check blood sugar, change every 10 days   Lantus SoloStar 100 UNIT/ML Solostar Pen Generic drug: insulin glargine Inject 50 units every morning   NovoLOG FlexPen 100 UNIT/ML FlexPen Generic drug: insulin aspart FOR AS NEEDED USE, TOTAL OF 30 UNITS PER DAY   Omnipod 5 G6 Pods (Gen 5) Misc 1 Device by Does not apply route every 3 (three) days.   Pen Needles 30G X 8 MM Misc 1 each by Does not apply route daily. [id number]   Trulicity 3 0000000 Sopn Generic drug: Dulaglutide Inject 3 mg as directed once a week.        Allergies:  Allergies  Allergen Reactions   Penicillins     Just didn't work Has patient had a PCN reaction causing immediate rash, facial/tongue/throat swelling, SOB or lightheadedness with hypotension: unknown Has patient had a PCN reaction causing severe rash involving mucus membranes or skin necrosis: unknown Has patient had a PCN reaction that required hospitalization: Unknown Has patient had a PCN reaction occurring within the last 10 years: Unknown If all of the above answers are "NO", then may proceed with Cephalosporin use.     Past Medical History:  Diagnosis Date   Abscess of left hand 09/12/2015   Diabetes mellitus without complication ([place])    Hypertension     Past Surgical  History:  Procedure Laterality Date   I & D EXTREMITY Left 09/12/2015   Procedure: IRRIGATION AND DEBRIDEMENT LEFT RING FINGER ABSCESS;  Surgeon: Roseanne Kaufman, MD;  Location: [facility];  Service: Orthopedics;  Laterality: Left;   ORIF ULNAR FRACTURE Right 08/2008    Family History  Problem Relation Age of Onset   Diabetes Mother    Hypertension Mother    Cancer Father 49       colon   Hypertension Father    Diabetes Paternal Grandfather     Social History:  reports that he has never smoked.  He has never used smokeless tobacco. He reports current alcohol use of about 2.0 standard drinks of alcohol per week. He reports that he does not use drugs.  Review of Systems:  Last diabetic eye exam date unknown  Last urine microalbumin date: Unknown  Last foot exam date: 3/22  Symptoms of neuropathy: None  Hypertension: Managed by his PCP ACE/ARB medication: Olmesartan 20 mg  BP Readings from Last 3 Encounters:  12/26/22 124/84  11/29/22 118/80  08/13/22 (!) 144/90    Lipid management: Not on statins, he thinks his HDL is low but has not been told about high cholesterol by his PCP, no reports available    Lab Results  Component Value Date   CHOL 179 09/04/2014   Lab Results  Component Value Date   HDL 59 09/04/2014   Lab Results  Component Value Date   LDLCALC 100 (H) 09/04/2014   Lab Results  Component Value Date   TRIG 98 09/04/2014   Lab Results  Component Value Date   CHOLHDL 3.0 09/04/2014   No results found for: "LDLDIRECT"         Examination:   BP 124/84 (BP Location: Left Arm, Patient Position: Sitting, Cuff Size: Normal)   Pulse 83   Ht 6' (1.829 m)   Wt 285 lb 9.6 oz (129.5 kg)   SpO2 97%   BMI 38.73 kg/m   Body mass index is 38.73 kg/m.    ASSESSMENT/ PLAN:    Diabetes with obesity, insulin-dependent:   Current regimen: Omnipod 5 insulin pump with NovoLog  See history of present illness for detailed discussion of current diabetes  management, blood sugar patterns and problems identified  A1c is 9.1 last  He has had significantly better blood sugar control with using the OmniPod pump in the last 5 days Current problems identified include: Not always bolusing for all meals Occasionally underestimating how much he needs to bolus May need to bolus only 12 units for relatively smaller meals at dinnertime Needs to consistently keep the automated mode going and remember to switch anytime he is changing the pod or if it goes off for various reasons Low normal readings early morning  Recommendations:  Will use a target of 140 overnight until 8 AM and this was changed in the office Showed him how to use the activity mode when he plans to start exercising Likely needs to bolus at least 5 units for breakfast He can adjust his mealtime dose based on his carbohydrates with 8 units for relatively small meals and 15 units for larger meals or eating out Try to bolus consistently before starting to eat  Patient Instructions  5 Units for Breakfast   8-15 U for meals based on carbs  Bolus before each meal  Automated mode all the time    Total visit time for evaluation and management and counseling = 30 minutes  Ceferino Lang 12/28/2022, 10:41 AM

## 2022-12-26 NOTE — Patient Instructions (Addendum)
5 Units for Breakfast   8-15 U for meals based on carbs  Bolus before each meal  Automated mode all the time

## 2023-01-01 ENCOUNTER — Telehealth: Payer: Self-pay | Admitting: Nutrition

## 2023-01-01 NOTE — Telephone Encounter (Signed)
Patient reports that he does not want to come for another review, and that he is bolusing for all meals and snacks eaten, and giving himself corrections doses for all high blood sugar readings over 225.  He had no questions for me at this time

## 2023-01-06 ENCOUNTER — Encounter: Payer: Self-pay | Admitting: Endocrinology

## 2023-01-06 DIAGNOSIS — E1065 Type 1 diabetes mellitus with hyperglycemia: Secondary | ICD-10-CM

## 2023-01-06 MED ORDER — INSULIN ASPART 100 UNIT/ML IJ SOLN: 70.0000 [IU] | Freq: Every day | INTRAMUSCULAR | 2 refills | Status: DC

## 2023-01-07 ENCOUNTER — Telehealth: Payer: Self-pay

## 2023-01-07 NOTE — Telephone Encounter (Signed)
Novolog needs PA

## 2023-01-08 ENCOUNTER — Other Ambulatory Visit: Payer: Self-pay | Admitting: Endocrinology

## 2023-01-09 NOTE — Telephone Encounter (Signed)
Patient came in to office today and picked up sample of Novolog Flex Pen.

## 2023-01-13 ENCOUNTER — Other Ambulatory Visit (HOSPITAL_COMMUNITY): Payer: Self-pay

## 2023-01-14 ENCOUNTER — Other Ambulatory Visit (HOSPITAL_COMMUNITY): Payer: Self-pay

## 2023-01-24 ENCOUNTER — Encounter: Payer: Self-pay | Admitting: Endocrinology

## 2023-02-05 ENCOUNTER — Encounter: Payer: Self-pay | Admitting: Endocrinology

## 2023-02-06 ENCOUNTER — Other Ambulatory Visit: Payer: Self-pay

## 2023-02-06 MED ORDER — OMNIPOD 5 DEXG7G6 PODS GEN 5 MISC: 1.0000 | 3 refills | Status: DC

## 2023-02-14 ENCOUNTER — Other Ambulatory Visit: Payer: 59

## 2023-02-19 ENCOUNTER — Ambulatory Visit: Payer: 59 | Admitting: Endocrinology

## 2023-03-03 ENCOUNTER — Encounter: Payer: Self-pay | Admitting: "Endocrinology

## 2023-03-03 ENCOUNTER — Ambulatory Visit: Payer: 59 | Admitting: "Endocrinology

## 2023-03-05 ENCOUNTER — Encounter: Payer: Self-pay | Admitting: Nutrition

## 2023-03-16 ENCOUNTER — Other Ambulatory Visit: Payer: Self-pay | Admitting: Endocrinology

## 2023-03-16 DIAGNOSIS — E1165 Type 2 diabetes mellitus with hyperglycemia: Secondary | ICD-10-CM

## 2023-03-17 MED ORDER — DEXCOM G6 TRANSMITTER MISC: 2 refills | Status: AC

## 2023-03-25 ENCOUNTER — Other Ambulatory Visit (INDEPENDENT_AMBULATORY_CARE_PROVIDER_SITE_OTHER): Payer: 59

## 2023-03-25 DIAGNOSIS — E1065 Type 1 diabetes mellitus with hyperglycemia: Secondary | ICD-10-CM | POA: Diagnosis not present

## 2023-03-25 LAB — COMPREHENSIVE METABOLIC PANEL
ALT: 21 U/L (ref 0–53)
AST: 22 U/L (ref 0–37)
Albumin: 4.3 g/dL (ref 3.5–5.2)
Alkaline Phosphatase: 78 U/L (ref 39–117)
BUN: 14 mg/dL (ref 6–23)
CO2: 26 mEq/L (ref 19–32)
Calcium: 9.3 mg/dL (ref 8.4–10.5)
Chloride: 101 mEq/L (ref 96–112)
Creatinine, Ser: 0.93 mg/dL (ref 0.40–1.50)
GFR: 108.99 mL/min (ref >60.00)
Glucose, Bld: 175 mg/dL — ABNORMAL HIGH (ref 70–99)
Potassium: 4.1 mEq/L (ref 3.5–5.1)
Sodium: 136 mEq/L (ref 135–145)
Total Bilirubin: 0.8 mg/dL (ref 0.2–1.2)
Total Protein: 6.8 g/dL (ref 6.0–8.3)

## 2023-03-25 LAB — LIPID PANEL
Cholesterol: 198 mg/dL (ref 0–200)
HDL: 56.6 mg/dL (ref >39.00)
LDL Cholesterol: 119 mg/dL — ABNORMAL HIGH (ref 0–99)
NonHDL: 141.6
Total CHOL/HDL Ratio: 4
Triglycerides: 114 mg/dL (ref 0.0–149.0)
VLDL: 22.8 mg/dL (ref 0.0–40.0)

## 2023-03-25 LAB — HEMOGLOBIN A1C: Hgb A1c MFr Bld: 8.4 % — ABNORMAL HIGH (ref 4.6–6.5)

## 2023-03-27 ENCOUNTER — Encounter: Payer: Self-pay | Admitting: "Endocrinology

## 2023-03-27 ENCOUNTER — Ambulatory Visit (INDEPENDENT_AMBULATORY_CARE_PROVIDER_SITE_OTHER): Payer: 59 | Admitting: "Endocrinology

## 2023-03-27 VITALS — BP 120/80 | HR 93 | Ht 73.0 in | Wt 289.6 lb

## 2023-03-27 DIAGNOSIS — Z9641 Presence of insulin pump (external) (internal): Secondary | ICD-10-CM | POA: Diagnosis not present

## 2023-03-27 DIAGNOSIS — E7800 Pure hypercholesterolemia, unspecified: Secondary | ICD-10-CM

## 2023-03-27 DIAGNOSIS — E1165 Type 2 diabetes mellitus with hyperglycemia: Secondary | ICD-10-CM

## 2023-03-27 DIAGNOSIS — E1065 Type 1 diabetes mellitus with hyperglycemia: Secondary | ICD-10-CM | POA: Diagnosis not present

## 2023-03-27 MED ORDER — INSULIN ASPART 100 UNIT/ML IJ SOLN: INTRAMUSCULAR | 2 refills | Status: DC

## 2023-03-27 MED ORDER — OMNIPOD 5 DEXG7G6 PODS GEN 5 MISC: 1.0000 | 1 refills | Status: DC

## 2023-03-27 MED ORDER — GVOKE HYPOPEN 1-PACK 1 MG/0.2ML ~~LOC~~ SOAJ: 1.0000 mg | SUBCUTANEOUS | 2 refills | Status: AC | PRN

## 2023-03-27 NOTE — Progress Notes (Addendum)
Outpatient Endocrinology Note Daniel [place], MD  03/28/23   Daniel Massey Jul 25, 1991 [id number]  Referring Provider: Leonette Monarch, MD Primary Care Provider: Leonette Monarch, MD Reason for consultation: Subjective   Assessment & Plan  Daniel Massey was seen today for diabetes.  Diagnoses and all orders for this visit:  Uncontrolled type 1 diabetes mellitus with hyperglycemia (HCC) -     insulin aspart (NOVOLOG) 100 UNIT/ML injection; Up to 100 units via pump as needed  Insulin pump in place  Pure hypercholesterolemia  Other orders -     Glucagon (GVOKE HYPOPEN 1-PACK) 1 MG/0.2ML SOAJ; Inject 1 mg into the skin as needed (low blood sugar with impaired consciousness). -     Insulin Disposable Pump (OMNIPOD 5 G6 PODS, GEN 5,) MISC; 1 Device by Does not apply route every 3 (three) days.    Diabetes complicated by hyperglycemia  Hba1c goal less than 7.0, current Hba1c is 8.4. Will recommend the following: OMNIPOD settings: Basal rate: 1.2u/hr, I/C 1: 1 (pt is not carb counting), ISF: 1:40, target 110 with correction over 130, IOB 4 hours Instructed to bolus 5 min before meals   No known contraindications to any of above medications Glucagon -sent   Hyperlipidemia -Last LDL off goal: 119 -not on statin -Follow low fat diet and exercise   -Blood pressure goal <140/90 - Microalbumin/creatinine at goal < 30 - on ACE/ARB Olmesartan 20mg  qd -diet changes including salt restriction -limit eating outside -counseled BP targets per standards of diabetes care -Uncontrolled blood pressure can lead to retinopathy, nephropathy and cardiovascular and atherosclerotic heart disease  Reviewed and counseled on: -A1C target -Blood sugar targets -Complications of uncontrolled diabetes  -Checking blood sugar before meals and bedtime and bring log next visit -All medications with mechanism of action and side effects -Hypoglycemia management: rule of 15's, Glucagon Emergency Kit  and medical alert ID -low-carb low-fat plate-method diet -At least 20 minutes of physical activity per day -Annual dilated retinal eye exam and foot exam -compliance and follow up needs -follow up as scheduled or earlier if problem gets worse  Call if blood sugar is less than 70 or consistently above 250    Take a 15 gm snack of carbohydrate at bedtime before you go to sleep if your blood sugar is less than 100.    If you are going to fast after midnight for a test or procedure, ask your physician for instructions on how to reduce/decrease your insulin dose.    Call if blood sugar is less than 70 or consistently above 250  -Treating a low sugar by rule of 15  (15 gms of sugar every 15 min until sugar is more than 70) If you feel your sugar is low, test your sugar to be sure If your sugar is low (less than 70), then take 15 grams of a fast acting Carbohydrate (3-4 glucose tablets or glucose gel or 4 ounces of juice or regular soda) Recheck your sugar 15 min after treating low to make sure it is more than 70 If sugar is still less than 70, treat again with 15 grams of carbohydrate          Don't drive the hour of hypoglycemia  If unconscious/unable to eat or drink by mouth, use glucagon injection or nasal spray baqsimi and call 911. Can repeat again in 15 min if still unconscious.  Return in about 3 months (around 06/27/2023).   I have reviewed current medications, nurse's notes, allergies, vital  signs, past medical and surgical history, family medical history, and social history for this encounter. Counseled patient on symptoms, examination findings, lab findings, imaging results, treatment decisions and monitoring and prognosis. The patient understood the recommendations and agrees with the treatment plan. All questions regarding treatment plan were fully answered.  Daniel [place], MD  03/28/23   History of Present Illness Daniel Massey is a 32 y.o. year old male who presents for  evaluation of Type 1 diabetes mellitus.  Daniel Massey was first diagnosed in 2011.   Diabetes education +  Current diabetes regimen: OMNIPOD settings: Basal rate: 1.2u/hr, I/C 1: 1 (pt is not carb counting), ISF: 1:40, target 110 with correction over 130, IOB 4 hours  Recent history:     Non-insulin hypoglycemic drugs: Trulicity 3 mg weekly Previous insulin regimen: Basaglar 50 units daily, NovoLog 10 units before meals    Previous history: Non-insulin hypoglycemic drugs previously used: Metformin, Trulicity since 10/22 Insulin was started in 2012 after being on metformin for about a year and lack of control  COMPLICATIONS -  MI/Stroke -  retinopathy, due eye exam -  neuropathy -  nephropathy  BLOOD SUGAR DATA  CGM interpretation: At today's visit, we reviewed her CGM downloads. The full report is scanned in the media. Reviewing the CGM trends, blood sugars show slight upward trend early morning and at lunch  Physical Exam  BP 120/80   Pulse 93   Ht 6\' 1"  (1.854 m)   Wt 289 lb 9.6 oz (131.4 kg)   SpO2 98%   BMI 38.21 kg/m    Constitutional: well developed, well nourished Head: normocephalic, atraumatic Eyes: sclera anicteric, no redness Neck: supple Lungs: normal respiratory effort Neurology: alert and oriented Skin: dry, no appreciable rashes Musculoskeletal: no appreciable defects Psychiatric: normal mood and affect Diabetic Foot Exam - Simple   Simple Foot Form Diabetic Foot exam was performed with the following findings: Yes 03/27/2023  9:15 AM  Visual Inspection No deformities, no ulcerations, no other skin breakdown bilaterally: Yes Sensation Testing Intact to touch and monofilament testing bilaterally: Yes Pulse Check Posterior Tibialis and Dorsalis pulse intact bilaterally: Yes Comments    Current Medications Patient's Medications  New Prescriptions   GLUCAGON (GVOKE HYPOPEN 1-PACK) 1 MG/0.2ML SOAJ    Inject 1 mg into the skin as needed (low  blood sugar with impaired consciousness).  Previous Medications   AMLODIPINE-OLMESARTAN (AZOR) 10-20 MG TABLET    Take 1 tablet by mouth daily.   CONTINUOUS BLOOD GLUC RECEIVER (DEXCOM G6 RECEIVER) DEVI    Use to check blood sugar daily   CONTINUOUS BLOOD GLUC SENSOR (DEXCOM G6 SENSOR) MISC    Use as instructed to check blood sugar, change every 10 days   CONTINUOUS BLOOD GLUC SENSOR (DEXCOM G7 SENSOR) MISC    1 Device by Does not apply route as directed. Change sensor every 10 days   CONTINUOUS GLUCOSE TRANSMITTER (DEXCOM G6 TRANSMITTER) MISC    Use as directed to check BS   DULAGLUTIDE (TRULICITY) 3 MG/0.5ML SOPN    Inject 3 mg as directed once a week.   INSULIN GLARGINE (LANTUS SOLOSTAR) 100 UNIT/ML SOLOSTAR PEN    Inject 50 units every morning   INSULIN PEN NEEDLE (PEN NEEDLES) 30G X 8 MM MISC    1 each by Does not apply route daily. E11.9  Modified Medications   Modified Medication Previous Medication   INSULIN ASPART (NOVOLOG) 100 UNIT/ML INJECTION insulin aspart (NOVOLOG) 100 UNIT/ML injection      Up  to 100 units via pump as needed    Inject 70 Units into the skin daily. Via pump.   INSULIN DISPOSABLE PUMP (OMNIPOD 5 G6 PODS, GEN 5,) MISC Insulin Disposable Pump (OMNIPOD 5 G6 PODS, GEN 5,) MISC      1 Device by Does not apply route every 3 (three) days.    1 Device by Does not apply route every 3 (three) days.  Discontinued Medications   No medications on file    Allergies Allergies  Allergen Reactions   Penicillins     Just didn't work Has patient had a PCN reaction causing immediate rash, facial/tongue/throat swelling, SOB or lightheadedness with hypotension: unknown Has patient had a PCN reaction causing severe rash involving mucus membranes or skin necrosis: unknown Has patient had a PCN reaction that required hospitalization: Unknown Has patient had a PCN reaction occurring within the last 10 years: Unknown If all of the above answers are "NO", then may proceed with  Cephalosporin use.     Past Medical History Past Medical History:  Diagnosis Date   Abscess of left hand 09/12/2015   Diabetes mellitus without complication (HCC)    Hypertension     Past Surgical History Past Surgical History:  Procedure Laterality Date   I & D EXTREMITY Left 09/12/2015   Procedure: IRRIGATION AND DEBRIDEMENT LEFT RING FINGER ABSCESS;  Surgeon: Dominica Severin, MD;  Location: [facility] OR;  Service: Orthopedics;  Laterality: Left;   ORIF ULNAR FRACTURE Right 08/2008    Family History family history includes Cancer (age of onset: 41) in his father; Diabetes in his mother and paternal grandfather; Hypertension in his father and mother.  Social History Social History   Socioeconomic History   Marital status: Married    Spouse name: [name]   Number of children: 0   Years of education: college   Highest education level: Not on file  Occupational History   Occupation: MLT    Comment: [employer]  Tobacco Use   Smoking status: Never   Smokeless tobacco: Never  Vaping Use   Vaping Use: Never used  Substance and Sexual Activity   Alcohol use: Yes    Alcohol/week: 2.0 standard drinks of alcohol    Types: 2 Standard drinks or equivalent per week   Drug use: No   Sexual activity: Yes    Partners: Female  Other Topics Concern   Not on file  Social History Narrative   Graduate of [employer] with a degree in Biology.   Lives with his girlfriend, Lorane Gell.   Family lives in [place], [place].   Social Determinants of Health   Financial Resource Strain: Not on file  Food Insecurity: Not on file  Transportation Needs: Not on file  Physical Activity: Not on file  Stress: Not on file  Social Connections: Not on file  Intimate Partner Violence: Not on file    Lab Results  Component Value Date   HGBA1C 8.4 (H) 03/25/2023   HGBA1C 9.1 (A) 08/13/2022   HGBA1C 8.2 (A) 11/09/2021   Lab Results  Component Value Date   CHOL 198 03/25/2023   Lab Results  Component  Value Date   HDL 56.60 03/25/2023   Lab Results  Component Value Date   LDLCALC 119 (H) 03/25/2023   Lab Results  Component Value Date   TRIG 114.0 03/25/2023   Lab Results  Component Value Date   CHOLHDL 4 03/25/2023   Lab Results  Component Value Date   CREATININE 0.93 03/25/2023   Lab Results  Component Value Date   GFR 108.99 03/25/2023   Lab Results  Component Value Date   MICROALBUR 1.6 08/14/2022      Component Value Date/Time   NA 136 03/25/2023 0922   K 4.1 03/25/2023 0922   CL 101 03/25/2023 0922   CO2 26 03/25/2023 0922   GLUCOSE 175 (H) 03/25/2023 0922   BUN 14 03/25/2023 0922   CREATININE 0.93 03/25/2023 0922   CREATININE 0.95 09/04/2014 1208   CALCIUM 9.3 03/25/2023 0922   PROT 6.8 03/25/2023 0922   ALBUMIN 4.3 03/25/2023 0922   AST 22 03/25/2023 0922   ALT 21 03/25/2023 0922   ALKPHOS 78 03/25/2023 0922   BILITOT 0.8 03/25/2023 0922   GFRNONAA 37 (L) 09/16/2015 0640   GFRNONAA >89 09/04/2014 1208   GFRAA 43 (L) 09/16/2015 0640   GFRAA >89 09/04/2014 1208      Latest Ref Rng & Units 03/25/2023    9:22 AM 08/14/2022    8:02 AM 09/16/2015    6:40 AM  BMP  Glucose 70 - 99 mg/dL 540  981  191   BUN 6 - 23 mg/dL 14  15  15    Creatinine 0.40 - 1.50 mg/dL 4.78  2.95  6.21   Sodium 135 - 145 mEq/L 136  137  139   Potassium 3.5 - 5.1 mEq/L 4.1  3.7  4.2   Chloride 96 - 112 mEq/L 101  102  103   CO2 19 - 32 mEq/L 26  27  25    Calcium 8.4 - 10.5 mg/dL 9.3  9.3  9.3        Component Value Date/Time   WBC 11.4 (A) 11/29/2015 1812   WBC 16.4 (H) 09/12/2015 0844   RBC 5.31 11/29/2015 1812   RBC 5.40 09/12/2015 0844   HGB 15.3 11/29/2015 1812   HGB 15.8 09/12/2015 0844   HCT 44.5 11/29/2015 1812   HCT 46.0 09/12/2015 0844   PLT 198 09/12/2015 0844   MCV 83.8 11/29/2015 1812   MCH 28.8 11/29/2015 1812   MCH 29.3 09/12/2015 0844   MCHC 34.4 11/29/2015 1812   MCHC 34.3 09/12/2015 0844   RDW 12.6 09/12/2015 0844   LYMPHSABS 2.4 09/12/2015 0844    MONOABS 1.2 (H) 09/12/2015 0844   EOSABS 0.1 09/12/2015 0844   BASOSABS 0.0 09/12/2015 0844     Parts of this note may have been dictated using voice recognition software. There may be variances in spelling and vocabulary which are unintentional. Not all errors are proofread. Please notify the Thereasa Parkin if any discrepancies are noted or if the meaning of any statement is not clear.

## 2023-04-07 LAB — HM DIABETES EYE EXAM

## 2023-05-26 ENCOUNTER — Encounter: Payer: Self-pay | Admitting: Family Medicine

## 2023-05-26 ENCOUNTER — Ambulatory Visit (INDEPENDENT_AMBULATORY_CARE_PROVIDER_SITE_OTHER): Payer: 59 | Admitting: Family Medicine

## 2023-05-26 VITALS — BP 114/78 | HR 90 | Temp 98.0°F | Resp 16 | Ht 73.0 in | Wt 290.2 lb

## 2023-05-26 DIAGNOSIS — E108 Type 1 diabetes mellitus with unspecified complications: Secondary | ICD-10-CM | POA: Diagnosis not present

## 2023-05-26 DIAGNOSIS — Z6838 Body mass index (BMI) 38.0-38.9, adult: Secondary | ICD-10-CM

## 2023-05-26 DIAGNOSIS — Z800 Family history of malignant neoplasm of digestive organs: Secondary | ICD-10-CM | POA: Diagnosis not present

## 2023-05-26 DIAGNOSIS — I152 Hypertension secondary to endocrine disorders: Secondary | ICD-10-CM

## 2023-05-26 MED ORDER — AMLODIPINE-OLMESARTAN 10-20 MG PO TABS
1.0000 | ORAL_TABLET | Freq: Every day | ORAL | 3 refills | Status: DC
Start: 2023-05-26 — End: 2023-11-24

## 2023-05-26 NOTE — Assessment & Plan Note (Signed)
Chronic Recent A1c 8.6 Insulin Pump basal rate 1.2 max 2.1 Bolus with meals Eye completed, requested records Foot exam completed with Endo Follows with [facility]

## 2023-05-26 NOTE — Assessment & Plan Note (Signed)
BMI elevate with DM T1, HTN, HLD Review previous records Consider obesity labs at next visit Consider nutritional counseling

## 2023-05-26 NOTE — Patient Instructions (Addendum)
It was a pleasure meeting you today. Thank you for allowing me to take part in your health care.  Our goals for today as we discussed include:  Refill sent for requested medication  Follow up with Endocrinology as scheduled   This is a list of the screening recommended for you and due dates:  Health Maintenance  Topic Date Due   Eye exam for diabetics  Never done   HIV Screening  Never done   Hepatitis C Screening  Never done   COVID-19 Vaccine (4 - 2023-24 season) 06/14/2022   Flu Shot  01/12/2024*   Yearly kidney health urinalysis for diabetes  08/15/2023   Hemoglobin A1C  09/24/2023   Yearly kidney function blood test for diabetes  03/24/2024   Complete foot exam   03/26/2024   DTaP/Tdap/Td vaccine (2 - Td or Tdap) 04/11/2029   HPV Vaccine  Aged Out  *Topic was postponed. The date shown is not the original due date.     If you have any questions or concerns, please do not hesitate to call the office at [phone].  I look forward to our next visit and until then take care and stay safe.  Regards,   Dana Allan, MD   [facility]

## 2023-05-26 NOTE — Progress Notes (Signed)
SUBJECTIVE:   Chief Complaint  Patient presents with   Establish Care   HPI Presents to clinic to establish care  DM Type 1  Diagnosed at 64yrs.  Now on Omnipod.  Recent A1c 8.6.  No recent hypoglycemic events.  Asymptomatic.  Follows with [facility]  HTN Asymptomatic.  Denies headaches, visual changes, chest pain, shortness of breath or lower extremity edema.  Currently takes Amlodipine-Olmesartan 10-20 mg daily and tolerating well.     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 Former THC use EtOH - 2-3 glasses wine at dinner  OBJECTIVE:  BP 114/78   Pulse 90   Temp 98 F (36.7 C)   Resp 16   Ht 6\' 1"  (1.854 m)   Wt 290 lb 4 oz (131.7 kg)   SpO2 97%   BMI 38.29 kg/m    Physical Exam Vitals reviewed.  HENT:     Head: Normocephalic.     Right Ear: Tympanic membrane, ear canal and external ear normal.     Left Ear: Tympanic membrane, ear canal and external ear normal.     Nose: Nose normal.     Mouth/Throat:     Mouth: Mucous membranes are moist.  Eyes:     Conjunctiva/sclera: Conjunctivae normal.     Pupils: Pupils are equal, round, and reactive to light.  Neck:     Thyroid: No thyromegaly or thyroid tenderness.     Vascular: No carotid bruit.  Cardiovascular:     Rate and Rhythm: Normal rate and regular rhythm.     Pulses: Normal pulses.     Heart sounds: Normal heart sounds.  Pulmonary:     Effort: Pulmonary effort is normal.     Breath sounds: Normal breath sounds.  Abdominal:     General: Abdomen is flat. Bowel sounds are normal.     Palpations: Abdomen is soft.  Musculoskeletal:        General: Normal range of motion.     Cervical back: Normal range of motion and neck supple.     Right lower leg: No edema.     Left lower leg: No edema.  Lymphadenopathy:     Cervical: No cervical adenopathy.  Neurological:     Mental Status: He is alert.   Psychiatric:        Mood and Affect: Mood normal.        Behavior: Behavior normal.        Thought Content: Thought content normal.        Judgment: Judgment normal.        05/26/2023   10:04 AM 11/29/2015    5:33 PM 09/11/2015    5:01 PM 09/10/2015    8:13 AM  Depression screen PHQ 2/9  Decreased Interest 0 0 0 0  Down, Depressed, Hopeless 0 0 0 0  PHQ - 2 Score 0 0 0 0  Altered sleeping 0     Tired, decreased energy 0     Change in appetite 0     Feeling bad or failure about yourself  0     Trouble concentrating 0     Moving slowly or fidgety/restless 0     Suicidal thoughts 0     PHQ-9 Score 0     Difficult doing work/chores Not difficult at all         05/26/2023   10:05 AM  GAD 7 :  Generalized Anxiety Score  Nervous, Anxious, on Edge 0  Control/stop worrying 0  Worry too much - different things 0  Trouble relaxing 0  Restless 0  Easily annoyed or irritable 0  Afraid - awful might happen 0  Total GAD 7 Score 0  Anxiety Difficulty Not difficult at all    ASSESSMENT/PLAN:  Hypertension associated with diabetes [facility]) Assessment & Plan: Chronic.  Well controlled on current medication Recent Cr wnl Refill Amlodipine-Olmesartan 10-20 mg daily Monitor BP at home, goal <140/90   Orders: -     amLODIPine-Olmesartan; Take 1 tablet by mouth daily.  Dispense: 90 tablet; Refill: 3  Type 1 diabetes mellitus with complications (HCC) Assessment & Plan: Chronic Recent A1c 8.6 Insulin Pump basal rate 1.2 max 2.1 Bolus with meals Eye completed, requested records Foot exam completed with Endo Follows with [facility]    Morbid obesity (HCC) Assessment & Plan: BMI elevate with DM T1, HTN, HLD Review previous records Consider obesity labs at next visit Consider nutritional counseling       Family history of colon cancer in father Assessment & Plan: Had colonoscopy 2023 for family history of paternal colon cancer mid 75's.   [facility]    PDMP reviewed  Return in about 6 months (around 11/26/2023), or if symptoms worsen or fail to improve, for PCP.  Dana Allan, MD

## 2023-05-26 NOTE — Assessment & Plan Note (Signed)
Chronic.  Well controlled on current medication Recent Cr wnl Refill Amlodipine-Olmesartan 10-20 mg daily Monitor BP at home, goal <140/90

## 2023-05-26 NOTE — Assessment & Plan Note (Signed)
Had colonoscopy 2023 for family history of paternal colon cancer mid 80's.   Request records from [facility]

## 2023-06-24 ENCOUNTER — Encounter: Payer: Self-pay | Admitting: "Endocrinology

## 2023-07-07 ENCOUNTER — Ambulatory Visit: Payer: 59 | Admitting: "Endocrinology

## 2023-07-23 ENCOUNTER — Encounter: Payer: Self-pay | Admitting: "Endocrinology

## 2023-07-23 ENCOUNTER — Ambulatory Visit (INDEPENDENT_AMBULATORY_CARE_PROVIDER_SITE_OTHER): Payer: 59 | Admitting: "Endocrinology

## 2023-07-23 VITALS — BP 120/82 | HR 86 | Resp 20 | Ht 73.0 in | Wt 303.6 lb

## 2023-07-23 DIAGNOSIS — E1065 Type 1 diabetes mellitus with hyperglycemia: Secondary | ICD-10-CM

## 2023-07-23 DIAGNOSIS — Z9641 Presence of insulin pump (external) (internal): Secondary | ICD-10-CM | POA: Diagnosis not present

## 2023-07-23 DIAGNOSIS — E7800 Pure hypercholesterolemia, unspecified: Secondary | ICD-10-CM | POA: Diagnosis not present

## 2023-07-23 LAB — POCT GLYCOSYLATED HEMOGLOBIN (HGB A1C): Hemoglobin A1C: 7.7 % — AB (ref 4.0–5.6)

## 2023-07-23 NOTE — Progress Notes (Signed)
Outpatient Endocrinology Note [place], [place]  07/23/23   Daniel Massey 05-Nov-1990 [id number]  Referring Provider: Leonette Monarch, MD Primary Care Provider: Dana Allan, MD Reason for consultation: Subjective   Assessment & Plan  Diagnoses and all orders for this visit:  Uncontrolled type 1 diabetes mellitus with hyperglycemia (HCC) -     POCT glycosylated hemoglobin (Hb A1C)  Insulin pump in place -     POCT glycosylated hemoglobin (Hb A1C)  Pure hypercholesterolemia    Diabetes complicated by hyperglycemia  Hba1c goal less than 7.0, current Hba1c is 7.7. Will recommend the following: OMNIPOD settings: Basal rate: 1.4u/hr, (gives bolus of 12-15 units tidac) I/C 1: 1 (pt is not carb counting), ISF: 1:40, target 110 with correction over 130, IOB 4 hours Instructed to bolus 5 min before meals   No known contraindications to any of above medications Glucagon -sent   Hyperlipidemia -Last LDL off goal: 119 -not on statin -Follow low fat diet and exercise   -Blood pressure goal <140/90 - Microalbumin/creatinine at goal < 30 - on ACE/ARB Olmesartan 20mg  qd -diet changes including salt restriction -limit eating outside -counseled BP targets per standards of diabetes care -Uncontrolled blood pressure can lead to retinopathy, nephropathy and cardiovascular and atherosclerotic heart disease  Reviewed and counseled on: -A1C target -Blood sugar targets -Complications of uncontrolled diabetes  -Checking blood sugar before meals and bedtime and bring log next visit -All medications with mechanism of action and side effects -Hypoglycemia management: rule of 15's, Glucagon Emergency Kit and medical alert ID -low-carb low-fat plate-method diet -At least 20 minutes of physical activity per day -Annual dilated retinal eye exam and foot exam -compliance and follow up needs -follow up as scheduled or earlier if problem gets worse  Call if blood sugar is less than  70 or consistently above 250    Take a 15 gm snack of carbohydrate at bedtime before you go to sleep if your blood sugar is less than 100.    If you are going to fast after midnight for a test or procedure, ask your physician for instructions on how to reduce/decrease your insulin dose.    Call if blood sugar is less than 70 or consistently above 250  -Treating a low sugar by rule of 15  (15 gms of sugar every 15 min until sugar is more than 70) If you feel your sugar is low, test your sugar to be sure If your sugar is low (less than 70), then take 15 grams of a fast acting Carbohydrate (3-4 glucose tablets or glucose gel or 4 ounces of juice or regular soda) Recheck your sugar 15 min after treating low to make sure it is more than 70 If sugar is still less than 70, treat again with 15 grams of carbohydrate          Don't drive the hour of hypoglycemia  If unconscious/unable to eat or drink by mouth, use glucagon injection or nasal spray baqsimi and call 911. Can repeat again in 15 min if still unconscious.  Return in about 3 months (around 10/23/2023).   I have reviewed current medications, nurse's notes, allergies, vital signs, past medical and surgical history, family medical history, and social history for this encounter. Counseled patient on symptoms, examination findings, lab findings, imaging results, treatment decisions and monitoring and prognosis. The patient understood the recommendations and agrees with the treatment plan. All questions regarding treatment plan were fully answered.  [place], MD  07/23/23  History of Present Illness Daniel Massey is a 32 y.o. year old male who presents for evaluation of Type 1 diabetes mellitus.  Daniel Massey was first diagnosed in 2011.   Diabetes education +  Current diabetes regimen: OMNIPOD settings: Basal rate: 1.2u/hr, I/C 1: 1 (pt is not carb counting), ISF: 1:40, target 110 with correction over 130, IOB 4  hours    Recent history:     Non-insulin hypoglycemic drugs: Trulicity 3 mg weekly Previous insulin regimen: Basaglar 50 units daily, NovoLog 10 units before meals    Previous history: Non-insulin hypoglycemic drugs previously used: Metformin, Trulicity since 10/22 Insulin was started in 2012 after being on metformin for about a year and lack of control  COMPLICATIONS -  MI/Stroke -  retinopathy, due eye exam -  neuropathy -  nephropathy  BLOOD SUGAR DATA  CGM interpretation: At today's visit, we reviewed her CGM downloads. The full report is scanned in the media. Reviewing the CGM trends, blood sugars are elevated across the day.  Physical Exam  BP 120/82 (BP Location: Left Arm, Patient Position: Sitting, Cuff Size: Large)   Pulse 86   Resp 20   Ht 6\' 1"  (1.854 m)   Wt (!) 303 lb 9.6 oz (137.7 kg)   SpO2 98%   BMI 40.06 kg/m    Constitutional: well developed, well nourished Head: normocephalic, atraumatic Eyes: sclera anicteric, no redness Neck: supple Lungs: normal respiratory effort Neurology: alert and oriented Skin: dry, no appreciable rashes Musculoskeletal: no appreciable defects Psychiatric: normal mood and affect Diabetic Foot Exam - Simple   No data filed    Current Medications Patient's Medications  New Prescriptions   No medications on file  Previous Medications   AMLODIPINE-OLMESARTAN (AZOR) 10-20 MG TABLET    Take 1 tablet by mouth daily.   CONTINUOUS BLOOD GLUC RECEIVER (DEXCOM G6 RECEIVER) DEVI    Use to check blood sugar daily   CONTINUOUS BLOOD GLUC SENSOR (DEXCOM G6 SENSOR) MISC    Use as instructed to check blood sugar, change every 10 days   CONTINUOUS BLOOD GLUC SENSOR (DEXCOM G7 SENSOR) MISC    1 Device by Does not apply route as directed. Change sensor every 10 days   CONTINUOUS GLUCOSE TRANSMITTER (DEXCOM G6 TRANSMITTER) MISC    Use as directed to check BS   GLUCAGON (GVOKE HYPOPEN 1-PACK) 1 MG/0.2ML SOAJ    Inject 1 mg into the skin as  needed (low blood sugar with impaired consciousness).   INSULIN ASPART (NOVOLOG) 100 UNIT/ML INJECTION    Up to 100 units via pump as needed   INSULIN DISPOSABLE PUMP (OMNIPOD 5 G6 PODS, GEN 5,) MISC    1 Device by Does not apply route every 3 (three) days.   INSULIN PEN NEEDLE (PEN NEEDLES) 30G X 8 MM MISC    1 each by Does not apply route daily. E11.9  Modified Medications   No medications on file  Discontinued Medications   No medications on file    Allergies Allergies  Allergen Reactions   Penicillins     Just didn't work Has patient had a PCN reaction causing immediate rash, facial/tongue/throat swelling, SOB or lightheadedness with hypotension: unknown Has patient had a PCN reaction causing severe rash involving mucus membranes or skin necrosis: unknown Has patient had a PCN reaction that required hospitalization: Unknown Has patient had a PCN reaction occurring within the last 10 years: Unknown If all of the above answers are "NO", then may proceed with Cephalosporin use.  Past Medical History Past Medical History:  Diagnosis Date   Abscess of left hand 09/12/2015   Diabetes mellitus without complication (HCC)    Hypertension     Past Surgical History Past Surgical History:  Procedure Laterality Date   I & D EXTREMITY Left 09/12/2015   Procedure: IRRIGATION AND DEBRIDEMENT LEFT RING FINGER ABSCESS;  Surgeon: Dominica Severin, MD;  Location: [facility] OR;  Service: Orthopedics;  Laterality: Left;   ORIF ULNAR FRACTURE Right 08/2008    Family History family history includes Cancer (age of onset: 80) in his father; Diabetes in his mother and paternal grandfather; Hypertension in his father and mother.  Social History Social History   Socioeconomic History   Marital status: Married    Spouse name: [name]   Number of children: 0   Years of education: college   Highest education level: Not on file  Occupational History   Occupation: MLT    Comment: [employer]   Tobacco Use   Smoking status: Never   Smokeless tobacco: Never  Vaping Use   Vaping status: Never Used  Substance and Sexual Activity   Alcohol use: Yes    Alcohol/week: 2.0 standard drinks of alcohol    Types: 2 Standard drinks or equivalent per week   Drug use: No   Sexual activity: Yes    Partners: Female  Other Topics Concern   Not on file  Social History Narrative   Graduate of [employer] with a degree in Biology.   Lives with his girlfriend, Lorane Gell.   Family lives in [place], [place].   Social Determinants of Health   Financial Resource Strain: Not on file  Food Insecurity: Not on file  Transportation Needs: Not on file  Physical Activity: Not on file  Stress: Not on file  Social Connections: Not on file  Intimate Partner Violence: Not on file    Lab Results  Component Value Date   HGBA1C 7.7 (A) 07/23/2023   HGBA1C 8.4 (H) 03/25/2023   HGBA1C 9.1 (A) 08/13/2022   Lab Results  Component Value Date   CHOL 198 03/25/2023   Lab Results  Component Value Date   HDL 56.60 03/25/2023   Lab Results  Component Value Date   LDLCALC 119 (H) 03/25/2023   Lab Results  Component Value Date   TRIG 114.0 03/25/2023   Lab Results  Component Value Date   CHOLHDL 4 03/25/2023   Lab Results  Component Value Date   CREATININE 0.93 03/25/2023   Lab Results  Component Value Date   GFR 108.99 03/25/2023   Lab Results  Component Value Date   MICROALBUR 1.6 08/14/2022      Component Value Date/Time   NA 136 03/25/2023 0922   K 4.1 03/25/2023 0922   CL 101 03/25/2023 0922   CO2 26 03/25/2023 0922   GLUCOSE 175 (H) 03/25/2023 0922   BUN 14 03/25/2023 0922   CREATININE 0.93 03/25/2023 0922   CREATININE 0.95 09/04/2014 1208   CALCIUM 9.3 03/25/2023 0922   PROT 6.8 03/25/2023 0922   ALBUMIN 4.3 03/25/2023 0922   AST 22 03/25/2023 0922   ALT 21 03/25/2023 0922   ALKPHOS 78 03/25/2023 0922   BILITOT 0.8 03/25/2023 0922   GFRNONAA 37 (L) 09/16/2015 0640   GFRNONAA  >89 09/04/2014 1208   GFRAA 43 (L) 09/16/2015 0640   GFRAA >89 09/04/2014 1208      Latest Ref Rng & Units 03/25/2023    9:22 AM 08/14/2022    8:02 AM 09/16/2015  6:40 AM  BMP  Glucose 70 - 99 mg/dL 161  096  045   BUN 6 - 23 mg/dL 14  15  15    Creatinine 0.40 - 1.50 mg/dL 4.09  8.11  9.14   Sodium 135 - 145 mEq/L 136  137  139   Potassium 3.5 - 5.1 mEq/L 4.1  3.7  4.2   Chloride 96 - 112 mEq/L 101  102  103   CO2 19 - 32 mEq/L 26  27  25    Calcium 8.4 - 10.5 mg/dL 9.3  9.3  9.3        Component Value Date/Time   WBC 11.4 (A) 11/29/2015 1812   WBC 16.4 (H) 09/12/2015 0844   RBC 5.31 11/29/2015 1812   RBC 5.40 09/12/2015 0844   HGB 15.3 11/29/2015 1812   HGB 15.8 09/12/2015 0844   HCT 44.5 11/29/2015 1812   HCT 46.0 09/12/2015 0844   PLT 198 09/12/2015 0844   MCV 83.8 11/29/2015 1812   MCH 28.8 11/29/2015 1812   MCH 29.3 09/12/2015 0844   MCHC 34.4 11/29/2015 1812   MCHC 34.3 09/12/2015 0844   RDW 12.6 09/12/2015 0844   LYMPHSABS 2.4 09/12/2015 0844   MONOABS 1.2 (H) 09/12/2015 0844   EOSABS 0.1 09/12/2015 0844   BASOSABS 0.0 09/12/2015 0844     Parts of this note may have been dictated using voice recognition software. There may be variances in spelling and vocabulary which are unintentional. Not all errors are proofread. Please notify the [facility] if any discrepancies are noted or if the meaning of any statement is not clear.

## 2023-07-23 NOTE — Patient Instructions (Signed)

## 2023-07-24 ENCOUNTER — Encounter: Payer: Self-pay | Admitting: "Endocrinology

## 2023-10-14 ENCOUNTER — Encounter: Payer: Self-pay | Admitting: "Endocrinology

## 2023-10-16 ENCOUNTER — Other Ambulatory Visit: Payer: Self-pay

## 2023-10-16 DIAGNOSIS — E1065 Type 1 diabetes mellitus with hyperglycemia: Secondary | ICD-10-CM

## 2023-10-16 MED ORDER — DEXCOM G6 SENSOR MISC
3 refills | Status: DC
Start: 2023-10-16 — End: 2023-12-29

## 2023-10-23 ENCOUNTER — Ambulatory Visit: Payer: 59 | Admitting: "Endocrinology

## 2023-10-31 ENCOUNTER — Encounter: Payer: Self-pay | Admitting: "Endocrinology

## 2023-11-15 ENCOUNTER — Encounter: Payer: Self-pay | Admitting: "Endocrinology

## 2023-11-18 ENCOUNTER — Ambulatory Visit: Payer: 59 | Admitting: Family Medicine

## 2023-11-24 ENCOUNTER — Ambulatory Visit (INDEPENDENT_AMBULATORY_CARE_PROVIDER_SITE_OTHER): Payer: 59 | Admitting: "Endocrinology

## 2023-11-24 ENCOUNTER — Encounter: Payer: Self-pay | Admitting: Family Medicine

## 2023-11-24 ENCOUNTER — Ambulatory Visit (INDEPENDENT_AMBULATORY_CARE_PROVIDER_SITE_OTHER): Payer: 59 | Admitting: Family Medicine

## 2023-11-24 VITALS — BP 122/82 | HR 85 | Temp 98.2°F | Resp 18 | Ht 73.0 in | Wt 294.2 lb

## 2023-11-24 VITALS — BP 138/92 | HR 89 | Resp 18 | Ht 73.0 in | Wt 285.0 lb

## 2023-11-24 DIAGNOSIS — R21 Rash and other nonspecific skin eruption: Secondary | ICD-10-CM | POA: Diagnosis not present

## 2023-11-24 DIAGNOSIS — E1065 Type 1 diabetes mellitus with hyperglycemia: Secondary | ICD-10-CM

## 2023-11-24 DIAGNOSIS — E7800 Pure hypercholesterolemia, unspecified: Secondary | ICD-10-CM

## 2023-11-24 DIAGNOSIS — Z9641 Presence of insulin pump (external) (internal): Secondary | ICD-10-CM | POA: Diagnosis not present

## 2023-11-24 DIAGNOSIS — I152 Hypertension secondary to endocrine disorders: Secondary | ICD-10-CM

## 2023-11-24 DIAGNOSIS — E108 Type 1 diabetes mellitus with unspecified complications: Secondary | ICD-10-CM | POA: Diagnosis not present

## 2023-11-24 DIAGNOSIS — Z6838 Body mass index (BMI) 38.0-38.9, adult: Secondary | ICD-10-CM

## 2023-11-24 DIAGNOSIS — E1059 Type 1 diabetes mellitus with other circulatory complications: Secondary | ICD-10-CM

## 2023-11-24 LAB — POCT GLYCOSYLATED HEMOGLOBIN (HGB A1C): Hemoglobin A1C: 9.4 % — AB (ref 4.0–5.6)

## 2023-11-24 MED ORDER — VALACYCLOVIR HCL 1 G PO TABS: 1000.0000 mg | ORAL_TABLET | Freq: Three times a day (TID) | ORAL | 0 refills | Status: AC

## 2023-11-24 MED ORDER — AMLODIPINE-OLMESARTAN 10-20 MG PO TABS
1.0000 | ORAL_TABLET | Freq: Every day | ORAL | 3 refills | Status: AC
Start: 2023-11-24 — End: ?

## 2023-11-24 MED ORDER — HYDROCORTISONE 2.5 % EX OINT: TOPICAL_OINTMENT | Freq: Two times a day (BID) | CUTANEOUS | 0 refills | Status: AC | PRN

## 2023-11-24 NOTE — Progress Notes (Signed)
 Outpatient Endocrinology Note Daniel Newcomer, MD  11/24/23   Daniel Massey 1991/09/12 [id number]  Referring Provider: Valli Gaw, MD Primary Care Provider: Valli Gaw, MD Reason for consultation: Subjective   Assessment & Plan  Daniel Massey was seen today for medical management of chronic issues.  Diagnoses and all orders for this visit:  Uncontrolled type 1 diabetes mellitus with hyperglycemia (HCC) -     POCT glycosylated hemoglobin (Hb A1C)  Insulin  pump in place  Pure hypercholesterolemia   Diabetes complicated by hyperglycemia  Hba1c goal less than 7.0, current Hba1c is 7.7. Will recommend the following: OMNIPOD settings:  Basal rate: 1.4u/hr (1.1 between 4pm-7pm to prevent drops before supper) Bolus of 10 units for break fast and lunch and 13 units for supper Does not bolus for BF and lunch during working days, instructed to bolus 4 units on those days I/C 1: 1 (pt is not carb counting), ISF: 1:40, target 110 with correction over 130, IOB 4 hours Instructed to bolus 5 min before meals   No known contraindications to any of above medications Glucagon  -sent   Hyperlipidemia -Last LDL off goal: 119 -not on statin -Follow low fat diet and exercise   -Blood pressure goal <140/90 - Microalbumin/creatinine at goal < 30 - on ACE/ARB Olmesartan  20mg  qd -diet changes including salt restriction -limit eating outside -counseled BP targets per standards of diabetes care -Uncontrolled blood pressure can lead to retinopathy, nephropathy and cardiovascular and atherosclerotic heart disease  Reviewed and counseled on: -A1C target -Blood sugar targets -Complications of uncontrolled diabetes  -Checking blood sugar before meals and bedtime and bring log next visit -All medications with mechanism of action and side effects -Hypoglycemia management: rule of 15's, Glucagon  Emergency Kit and medical alert ID -low-carb low-fat plate-method diet -At least 20 minutes of  physical activity per day -Annual dilated retinal eye exam and foot exam -compliance and follow up needs -follow up as scheduled or earlier if problem gets worse  Call if blood sugar is less than 70 or consistently above 250    Take a 15 gm snack of carbohydrate at bedtime before you go to sleep if your blood sugar is less than 100.    If you are going to fast after midnight for a test or procedure, ask your physician for instructions on how to reduce/decrease your insulin  dose.    Call if blood sugar is less than 70 or consistently above 250  -Treating a low sugar by rule of 15  (15 gms of sugar every 15 min until sugar is more than 70) If you feel your sugar is low, test your sugar to be sure If your sugar is low (less than 70), then take 15 grams of a fast acting Carbohydrate (3-4 glucose tablets or glucose gel or 4 ounces of juice or regular soda) Recheck your sugar 15 min after treating low to make sure it is more than 70 If sugar is still less than 70, treat again with 15 grams of carbohydrate          Don't drive the hour of hypoglycemia  If unconscious/unable to eat or drink by mouth, use glucagon  injection or nasal spray baqsimi and call 911. Can repeat again in 15 min if still unconscious.  Return in about 4 weeks (around 12/22/2023).   I have reviewed current medications, nurse's notes, allergies, vital signs, past medical and surgical history, family medical history, and social history for this encounter. Counseled patient on symptoms, examination findings, lab findings,  imaging results, treatment decisions and monitoring and prognosis. The patient understood the recommendations and agrees with the treatment plan. All questions regarding treatment plan were fully answered.  Daniel Newcomer, MD  11/24/23   History of Present Illness Daniel Massey is a 33 y.o. year old male who presents for evaluation of Type 1 diabetes mellitus.  Daniel Massey was first diagnosed in  2011.   Diabetes education +  Current diabetes regimen: OMNIPOD settings: Basal rate: 1.2u/hr, I/C 1: 1 (pt is not carb counting), ISF: 1:40, target 110 with correction over 130, IOB 4 hours Bolus of 10 units for break fast and lunch and 12 units for supper  Recent history:     Non-insulin  hypoglycemic drugs: Trulicity  3 mg weekly Previous insulin  regimen: Basaglar  50 units daily, NovoLog  10 units before meals    Previous history: Non-insulin  hypoglycemic drugs previously used: Metformin , Trulicity  since 10/22 Insulin  was started in 2012 after being on metformin  for about a year and lack of control  COMPLICATIONS -  MI/Stroke -  retinopathy, due eye exam -  neuropathy -  nephropathy  BLOOD SUGAR DATA  CGM interpretation: At today's visit, we reviewed her CGM downloads. The full report is scanned in the media. Reviewing the CGM trends, blood sugars are elevated before meals, specially supper  Physical Exam  BP (!) 138/92   Pulse 89   Resp 18   Ht 6\' 1"  (1.854 m)   Wt 285 lb (129.3 kg)   SpO2 99%   BMI 37.60 kg/m    Constitutional: well developed, well nourished Head: normocephalic, atraumatic Eyes: sclera anicteric, no redness Neck: supple Lungs: normal respiratory effort Neurology: alert and oriented Skin: dry, no appreciable rashes Musculoskeletal: no appreciable defects Psychiatric: normal mood and affect Diabetic Foot Exam - Simple   No data filed    Current Medications Patient's Medications  New Prescriptions   No medications on file  Previous Medications   AMLODIPINE -OLMESARTAN  (AZOR ) 10-20 MG TABLET    Take 1 tablet by mouth daily.   CONTINUOUS BLOOD GLUC RECEIVER (DEXCOM G6 RECEIVER) DEVI    Use to check blood sugar daily   CONTINUOUS BLOOD GLUC SENSOR (DEXCOM G7 SENSOR) MISC    1 Device by Does not apply route as directed. Change sensor every 10 days   CONTINUOUS GLUCOSE SENSOR (DEXCOM G6 SENSOR) MISC    Use as instructed to check blood sugar, change  every 10 days   CONTINUOUS GLUCOSE TRANSMITTER (DEXCOM G6 TRANSMITTER) MISC    Use as directed to check BS   GLUCAGON  (GVOKE HYPOPEN  1-PACK) 1 MG/0.2ML SOAJ    Inject 1 mg into the skin as needed (low blood sugar with impaired consciousness).   INSULIN  ASPART (NOVOLOG ) 100 UNIT/ML INJECTION    Up to 100 units via pump as needed   INSULIN  DISPOSABLE PUMP (OMNIPOD 5 G6 PODS, GEN 5,) MISC    1 Device by Does not apply route every 3 (three) days.   INSULIN  PEN NEEDLE (PEN NEEDLES) 30G X 8 MM MISC    1 each by Does not apply route daily. E11.9  Modified Medications   No medications on file  Discontinued Medications   No medications on file    Allergies Allergies  Allergen Reactions   Penicillins     Just didn't work Has patient had a PCN reaction causing immediate rash, facial/tongue/throat swelling, SOB or lightheadedness with hypotension: unknown Has patient had a PCN reaction causing severe rash involving mucus membranes or skin necrosis: unknown Has patient had  a PCN reaction that required hospitalization: Unknown Has patient had a PCN reaction occurring within the last 10 years: Unknown If all of the above answers are "NO", then may proceed with Cephalosporin use.     Past Medical History Past Medical History:  Diagnosis Date   Abscess of left hand 09/12/2015   Diabetes mellitus without complication (HCC)    Hypertension     Past Surgical History Past Surgical History:  Procedure Laterality Date   I & D EXTREMITY Left 09/12/2015   Procedure: IRRIGATION AND DEBRIDEMENT LEFT RING FINGER ABSCESS;  Surgeon: Ronn Cohn, MD;  Location: [facility] OR;  Service: Orthopedics;  Laterality: Left;   ORIF ULNAR FRACTURE Right 08/2008    Family History family history includes Cancer (age of onset: 71) in his father; Diabetes in his mother and paternal grandfather; Hypertension in his father and mother.  Social History Social History   Socioeconomic History   Marital status: Married     Spouse name: girlfriend-Daniel Massey   Number of children: 0   Years of education: college   Highest education level: Bachelor's degree (e.g., BA, AB, BS)  Occupational History   Occupation: MLT    Comment: [employer]  Tobacco Use   Smoking status: Never   Smokeless tobacco: Never  Vaping Use   Vaping status: Never Used  Substance and Sexual Activity   Alcohol use: Yes    Alcohol/week: 2.0 standard drinks of alcohol    Types: 2 Standard drinks or equivalent per week   Drug use: No   Sexual activity: Yes    Partners: Female  Other Topics Concern   Not on file  Social History Narrative   Graduate of [employer] with a degree in Biology.   Lives with his girlfriend, Daniel Massey.   Family lives in [place], [place].   Social Drivers of Corporate investment banker Strain: Low Risk  (11/24/2023)   Overall Financial Resource Strain (CARDIA)    Difficulty of Paying Living Expenses: Not hard at all  Food Insecurity: No Food Insecurity (11/24/2023)   Hunger Vital Sign    Worried About Running Out of Food in the Last Year: Never true    Ran Out of Food in the Last Year: Never true  Transportation Needs: No Transportation Needs (11/24/2023)   PRAPARE - Administrator, Civil Service (Medical): No    Lack of Transportation (Non-Medical): No  Physical Activity: Insufficiently Active (11/24/2023)   Exercise Vital Sign    Days of Exercise per Week: 1 day    Minutes of Exercise per Session: 30 min  Stress: No Stress Concern Present (11/24/2023)   [employer] of Occupational Health - Occupational Stress Questionnaire    Feeling of Stress : Not at all  Social Connections: Socially Integrated (11/24/2023)   Social Connection and Isolation Panel [NHANES]    Frequency of Communication with Friends and Family: Twice a week    Frequency of Social Gatherings with Friends and Family: Once a week    Attends Religious Services: More than 4 times per year    Active Member of Clubs or Organizations: Yes     Attends Banker Meetings: More than 4 times per year    Marital Status: Married  Catering manager Violence: Not on file    Lab Results  Component Value Date   HGBA1C 9.4 (A) 11/24/2023   HGBA1C 7.7 (A) 07/23/2023   HGBA1C 8.4 (H) 03/25/2023   Lab Results  Component Value Date   CHOL 198 03/25/2023  Lab Results  Component Value Date   HDL 56.60 03/25/2023   Lab Results  Component Value Date   LDLCALC 119 (H) 03/25/2023   Lab Results  Component Value Date   TRIG 114.0 03/25/2023   Lab Results  Component Value Date   CHOLHDL 4 03/25/2023   Lab Results  Component Value Date   CREATININE 0.93 03/25/2023   Lab Results  Component Value Date   GFR 108.99 03/25/2023   Lab Results  Component Value Date   MICROALBUR 1.6 08/14/2022      Component Value Date/Time   NA 136 03/25/2023 0922   K 4.1 03/25/2023 0922   CL 101 03/25/2023 0922   CO2 26 03/25/2023 0922   GLUCOSE 175 (H) 03/25/2023 0922   BUN 14 03/25/2023 0922   CREATININE 0.93 03/25/2023 0922   CREATININE 0.95 09/04/2014 1208   CALCIUM 9.3 03/25/2023 0922   PROT 6.8 03/25/2023 0922   ALBUMIN 4.3 03/25/2023 0922   AST 22 03/25/2023 0922   ALT 21 03/25/2023 0922   ALKPHOS 78 03/25/2023 0922   BILITOT 0.8 03/25/2023 0922   GFRNONAA 37 (L) 09/16/2015 0640   GFRNONAA >89 09/04/2014 1208   GFRAA 43 (L) 09/16/2015 0640   GFRAA >89 09/04/2014 1208      Latest Ref Rng & Units 03/25/2023    9:22 AM 08/14/2022    8:02 AM 09/16/2015    6:40 AM  BMP  Glucose 70 - 99 mg/dL 403  474  259   BUN 6 - 23 mg/dL 14  15  15    Creatinine 0.40 - 1.50 mg/dL 5.63  8.75  6.43   Sodium 135 - 145 mEq/L 136  137  139   Potassium 3.5 - 5.1 mEq/L 4.1  3.7  4.2   Chloride 96 - 112 mEq/L 101  102  103   CO2 19 - 32 mEq/L 26  27  25    Calcium 8.4 - 10.5 mg/dL 9.3  9.3  9.3        Component Value Date/Time   WBC 11.4 (A) 11/29/2015 1812   WBC 16.4 (H) 09/12/2015 0844   RBC 5.31 11/29/2015 1812   RBC 5.40  09/12/2015 0844   HGB 15.3 11/29/2015 1812   HGB 15.8 09/12/2015 0844   HCT 44.5 11/29/2015 1812   HCT 46.0 09/12/2015 0844   PLT 198 09/12/2015 0844   MCV 83.8 11/29/2015 1812   MCH 28.8 11/29/2015 1812   MCH 29.3 09/12/2015 0844   MCHC 34.4 11/29/2015 1812   MCHC 34.3 09/12/2015 0844   RDW 12.6 09/12/2015 0844   LYMPHSABS 2.4 09/12/2015 0844   MONOABS 1.2 (H) 09/12/2015 0844   EOSABS 0.1 09/12/2015 0844   BASOSABS 0.0 09/12/2015 0844     Parts of this note may have been dictated using voice recognition software. There may be variances in spelling and vocabulary which are unintentional. Not all errors are proofread. Please notify the [facility] if any discrepancies are noted or if the meaning of any statement is not clear.

## 2023-11-24 NOTE — Patient Instructions (Addendum)
 It was a pleasure meeting you today. Thank you for allowing me to take part in your health care.  Our goals for today as we discussed include:  We will get some labs today.  If they are abnormal or we need to do something about them, I will call you.  If they are normal, I will send you a message on MyChart (if it is active) or a letter in the mail.  If you don't hear from us  in 2 weeks, please call the office at the number below.   Trial Hydrocortisone  cream two times a day Start Valtrex  1000 mg three times a day for 10 day  Follow up with Endocrinology for Diabetes  Dermatology for your wife Dr. Nicholas Bari, MD FAAD [phone] [facility], [address],  [place], [place] [address]  This is a list of the screening recommended for you and due dates:  Health Maintenance  Topic Date Due   COVID-19 Vaccine (4 - 2024-25 season) 06/15/2023   Yearly kidney health urinalysis for diabetes  08/15/2023   Hepatitis C Screening  05/25/2024*   HIV Screening  05/25/2024*   Pneumococcal Vaccination (1 of 2 - PCV) 11/23/2024*   Yearly kidney function blood test for diabetes  03/24/2024   Complete foot exam   03/26/2024   Eye exam for diabetics  04/06/2024   Hemoglobin A1C  05/23/2024   DTaP/Tdap/Td vaccine (2 - Td or Tdap) 04/11/2029   Flu Shot  Completed   HPV Vaccine  Aged Out  *Topic was postponed. The date shown is not the original due date.      If you have any questions or concerns, please do not hesitate to call the office at [phone].  I look forward to our next visit and until then take care and stay safe.  Regards,   Valli Gaw, MD   [facility]

## 2023-11-24 NOTE — Progress Notes (Signed)
SUBJECTIVE:   Chief Complaint  Patient presents with   Medical Management of Chronic Issues    6 month follow up   HPI Presents for follow up hypertension  Discussed the use of AI scribe software for clinical note transcription with the patient, who gave verbal consent to proceed.  History of Present Illness Daniel Massey is a 33 year old male with type 1 diabetes who presents with a new rash on his chest.  He has a new rash on his chest that appeared approximately two days ago. It is described as a 'bump' that is slightly pruritic but not blistery, with minimal pain rated less than one out of ten. The rash consists of six small vesicles located in a dermatomal pattern on his chest. He noticed the rash after attending a large event over the weekend. No recent exposure to varicella or herpes zoster, and he denies having herpes labialis or fever blisters in the past.  He has a history of type 1 diabetes and is aware that his A1c levels have been elevated. He is scheduled to see endocrinology today. He has not been monitoring his blood pressure at home and reports no symptoms of chest pain or dyspnea.  He lives with his wife and does not have any children.      PERTINENT PMH / PSH: As above  OBJECTIVE:  BP 122/82   Pulse 85   Temp 98.2 F (36.8 C)   Resp 18   Ht 6\' 1"  (1.854 m)   Wt 294 lb 4 oz (133.5 kg)   SpO2 97%   BMI 38.82 kg/m    Physical Exam Constitutional:      General: He is not in acute distress.    Appearance: He is normal weight. He is not ill-appearing.  HENT:     Head: Normocephalic.  Eyes:     Conjunctiva/sclera: Conjunctivae normal.  Cardiovascular:     Rate and Rhythm: Normal rate and regular rhythm.     Pulses: Normal pulses.     Heart sounds: Normal heart sounds.  Pulmonary:     Effort: Pulmonary effort is normal.     Breath sounds: Normal breath sounds.  Skin:    Findings: Rash present. Rash is vesicular.  Neurological:     Mental  Status: He is alert. Mental status is at baseline.  Psychiatric:        Mood and Affect: Mood normal.        Behavior: Behavior normal.        Thought Content: Thought content normal.        Judgment: Judgment normal.            11/24/2023    3:33 PM 05/26/2023   10:04 AM 11/29/2015    5:33 PM 09/11/2015    5:01 PM 09/10/2015    8:13 AM  Depression screen PHQ 2/9  Decreased Interest 0 0 0 0 0  Down, Depressed, Hopeless 0 0 0 0 0  PHQ - 2 Score 0 0 0 0 0  Altered sleeping 0 0     Tired, decreased energy 0 0     Change in appetite 0 0     Feeling bad or failure about yourself  0 0     Trouble concentrating 0 0     Moving slowly or fidgety/restless 0 0     Suicidal thoughts 0 0     PHQ-9 Score 0 0     Difficult doing work/chores Not difficult at  all Not difficult at all         11/24/2023    3:33 PM 05/26/2023   10:05 AM  GAD 7 : Generalized Anxiety Score  Nervous, Anxious, on Edge 0 0  Control/stop worrying 0 0  Worry too much - different things 0 0  Trouble relaxing 0 0  Restless 0 0  Easily annoyed or irritable 0 0  Afraid - awful might happen 0 0  Total GAD 7 Score 0 0  Anxiety Difficulty Not difficult at all Not difficult at all    ASSESSMENT/PLAN:  Hypertension associated with diabetes [facility]) Assessment & Plan: Blood pressure slightly elevated but no symptoms of chest pain or shortness of breath. Patient not regularly monitoring blood pressure at home.   -Refill Azor 10-20 mg daily -Cmet today -Encourage patient to regularly monitor blood pressure at home.    Orders: -     Comprehensive metabolic panel -     amLODIPine-Olmesartan; Take 1 tablet by mouth daily.  Dispense: 90 tablet; Refill: 3  Type 1 diabetes mellitus with complications (HCC) Assessment & Plan: Chronic Recent A1c 9.2 Insulin Pump Bolus with meals Follows with [facility]   Orders: -     Microalbumin / creatinine urine ratio  Rash Assessment & Plan: New onset of a rash on  the chest, itchy with slight pain. No history of similar rashes or cold sores. Patient had chickenpox as a child. Possible shingles or herpes zoster given the dermatomal distribution.   -Start with hydrocortisone cream for itching.   -If no improvement or if rash spreads, start Valtrex 3 times a day for 10 days.     Orders: -     valACYclovir HCl; Take 1 tablet (1,000 mg total) by mouth 3 (three) times daily for 10 days.  Dispense: 30 tablet; Refill: 0 -     Hydrocortisone; Apply topically 2 (two) times daily as needed.  Dispense: 30 g; Refill: 0  Morbid obesity (HCC) Assessment & Plan: BMI elevate with DM T1, HTN, HLD Encourage healthy diet and increase in activity         Spouse's Dermatological Concern   Longstanding lesion on wife's buttock, possibly increased in size. Likely a sebaceous cyst based on the picture however, given cannot do compete assessment and patient not in clinic hard to determine.  Husband understands that this is not a confirmed diagnosis and cannot rule out other etiologies based on image.  Would need to have evaluation in clinic or with dermatology.     -Recommend dermatology consultation for further evaluation.  -If wife prefers, she can come in for a physical examination before dermatology referral.  -Dermatology resources provided.    PDMP reviewed  Return if symptoms worsen or fail to improve, for PCP.  Dana Allan, MD

## 2023-11-24 NOTE — Patient Instructions (Signed)

## 2023-11-25 ENCOUNTER — Encounter: Payer: Self-pay | Admitting: "Endocrinology

## 2023-11-25 ENCOUNTER — Encounter: Payer: Self-pay | Admitting: Family Medicine

## 2023-11-25 DIAGNOSIS — R21 Rash and other nonspecific skin eruption: Secondary | ICD-10-CM | POA: Insufficient documentation

## 2023-11-25 LAB — COMPREHENSIVE METABOLIC PANEL
ALT: 21 U/L (ref 0–53)
AST: 21 U/L (ref 0–37)
Albumin: 4.3 g/dL (ref 3.5–5.2)
Alkaline Phosphatase: 86 U/L (ref 39–117)
BUN: 13 mg/dL (ref 6–23)
CO2: 25 meq/L (ref 19–32)
Calcium: 9.4 mg/dL (ref 8.4–10.5)
Chloride: 103 meq/L (ref 96–112)
Creatinine, Ser: 0.81 mg/dL (ref 0.40–1.50)
GFR: 116.47 mL/min (ref >60.00)
Glucose, Bld: 222 mg/dL — ABNORMAL HIGH (ref 70–99)
Potassium: 3.8 meq/L (ref 3.5–5.1)
Sodium: 137 meq/L (ref 135–145)
Total Bilirubin: 0.8 mg/dL (ref 0.2–1.2)
Total Protein: 7.6 g/dL (ref 6.0–8.3)

## 2023-11-25 NOTE — Assessment & Plan Note (Signed)
New onset of a rash on the chest, itchy with slight pain. No history of similar rashes or cold sores. Patient had chickenpox as a child. Possible shingles or herpes zoster given the dermatomal distribution.   -Start with hydrocortisone cream for itching.   -If no improvement or if rash spreads, start Valtrex 3 times a day for 10 days.

## 2023-11-25 NOTE — Assessment & Plan Note (Signed)
Chronic Recent A1c 9.2 Insulin Pump Bolus with meals Follows with [facility]

## 2023-11-25 NOTE — Assessment & Plan Note (Signed)
Blood pressure slightly elevated but no symptoms of chest pain or shortness of breath. Patient not regularly monitoring blood pressure at home.   -Refill Azor 10-20 mg daily -Cmet today -Encourage patient to regularly monitor blood pressure at home.

## 2023-11-25 NOTE — Assessment & Plan Note (Signed)
BMI elevate with DM T1, HTN, HLD Encourage healthy diet and increase in activity

## 2023-11-26 ENCOUNTER — Ambulatory Visit: Payer: 59 | Admitting: Family Medicine

## 2023-11-27 LAB — MICROALBUMIN / CREATININE URINE RATIO
Creatinine,U: 93.4 mg/dL
Microalb Creat Ratio: 7.5 mg/g (ref 0.0–30.0)
Microalb, Ur: <0.7 mg/dL (ref 0.0–1.9)

## 2023-12-01 ENCOUNTER — Ambulatory Visit: Payer: 59 | Admitting: Family Medicine

## 2023-12-25 ENCOUNTER — Encounter: Payer: Self-pay | Admitting: "Endocrinology

## 2023-12-25 ENCOUNTER — Telehealth: Payer: Self-pay

## 2023-12-25 DIAGNOSIS — E1165 Type 2 diabetes mellitus with hyperglycemia: Secondary | ICD-10-CM

## 2023-12-25 NOTE — Telephone Encounter (Signed)
-----   Message from Dana Allan sent at 12/24/2023  5:43 PM EDT ----- Follow up with Endocrinology for Diabetes  No protein in urine

## 2023-12-25 NOTE — Telephone Encounter (Signed)
 Left message to return call to our office.  Okay to read results. Please document when spoke to.

## 2023-12-25 NOTE — Telephone Encounter (Signed)
 Copied from CRM [id number]. Topic: Clinical - Lab/Test Results >> Dec 25, 2023  3:15 PM Sim Boast F wrote: Reason for CRM: Patient returning [facility] phone call, I did read note from Dr. Clent Ridges regarding lab results but patient would like to discuss further

## 2023-12-26 ENCOUNTER — Ambulatory Visit: Payer: 59 | Admitting: "Endocrinology

## 2023-12-26 NOTE — Telephone Encounter (Signed)
 Patient would like a call back in regards to his labs he wants to know if it can be discussed over the phone

## 2023-12-26 NOTE — Telephone Encounter (Signed)
 Left message to return call to our office.  Please schedule pt an appointment to discuss lab results with Dr. Clent Ridges.

## 2023-12-26 NOTE — Telephone Encounter (Signed)
 Called and gave the result to pt and he was referring to the message that was sent to all [facility] pt. I did advise him that is went out to all pt, and if it effected him that Dr. Clent Ridges would have me reach out to him.

## 2023-12-29 MED ORDER — DEXCOM G7 SENSOR MISC: 1.0000 | 3 refills | Status: DC

## 2024-01-01 MED ORDER — DEXCOM G7 SENSOR MISC: 1.0000 | 0 refills | Status: DC

## 2024-01-01 NOTE — Addendum Note (Signed)
 Addended by: Beverely Pace on: 01/01/2024 02:59 PM   Modules accepted: Orders

## 2024-01-03 ENCOUNTER — Encounter: Payer: Self-pay | Admitting: "Endocrinology

## 2024-01-05 MED ORDER — OMNIPOD 5 DEXG7G6 PODS GEN 5 MISC: 1.0000 | 1 refills | Status: DC

## 2024-01-27 ENCOUNTER — Other Ambulatory Visit: Payer: Self-pay | Admitting: Obstetrics and Gynecology

## 2024-01-27 DIAGNOSIS — Z3169 Encounter for other general counseling and advice on procreation: Secondary | ICD-10-CM

## 2024-01-27 NOTE — Progress Notes (Signed)
 Semen analysis; wife is MRN: [id number]

## 2024-01-28 ENCOUNTER — Ambulatory Visit: Admitting: "Endocrinology

## 2024-02-19 ENCOUNTER — Encounter: Payer: Self-pay | Admitting: "Endocrinology

## 2024-02-19 ENCOUNTER — Encounter: Payer: Self-pay | Admitting: Obstetrics and Gynecology

## 2024-02-19 DIAGNOSIS — E1065 Type 1 diabetes mellitus with hyperglycemia: Secondary | ICD-10-CM

## 2024-02-19 DIAGNOSIS — R868 Other abnormal findings in specimens from male genital organs: Secondary | ICD-10-CM

## 2024-02-19 LAB — SEMEN ANALYSIS, BASIC
Appearance: NORMAL
Concentration, Sperm: 266.5 x10E6/mL (ref >14.9)
Immotile Sperm: 39 %
Leukocyte Concentration: <1 x10E6/mL (ref <1.00)
Non-Progressive (NP): 10 %
Normal Morphology-Strict: 13 % (ref >3)
Progressive Motility (PR): 51 % (ref >31)
Progressively Motile Sperm: 96 x10E6
Time Collected: 1243
Time Received: 1300
Time Since Last Emission: 10 d
Total Motile Sperm: 113.2 x10E6
Total Motility (PR+NP): 61 % (ref >39)
Total Sperm in Ejaculate: 186.5 x10E6 (ref >38.9)
Volume: 0.7 mL — ABNORMAL LOW (ref >1.4)
pH: 8 (ref >7.1)

## 2024-02-20 ENCOUNTER — Other Ambulatory Visit: Payer: Self-pay

## 2024-02-20 ENCOUNTER — Other Ambulatory Visit (HOSPITAL_COMMUNITY): Payer: Self-pay

## 2024-02-20 DIAGNOSIS — E1065 Type 1 diabetes mellitus with hyperglycemia: Secondary | ICD-10-CM

## 2024-02-20 MED ORDER — INSULIN ASPART 100 UNIT/ML IJ SOLN: INTRAMUSCULAR | 2 refills | Status: DC

## 2024-02-20 NOTE — Telephone Encounter (Signed)
 No PA required. Insurance will pay a MAX of 30 day supply. 30mL per 30 days is $41.72

## 2024-02-20 NOTE — Telephone Encounter (Signed)
 Spoke with the pharmacy insurance did approve 90 day supply of Novolog .

## 2024-02-20 NOTE — Telephone Encounter (Signed)
 Spoke to pt regarding Rx Novolog  100  informed pt that I sent the PA team a urgent message.

## 2024-03-12 ENCOUNTER — Ambulatory Visit: Admitting: "Endocrinology

## 2024-03-15 ENCOUNTER — Ambulatory Visit (INDEPENDENT_AMBULATORY_CARE_PROVIDER_SITE_OTHER): Admitting: "Endocrinology

## 2024-03-15 ENCOUNTER — Encounter: Payer: Self-pay | Admitting: "Endocrinology

## 2024-03-15 VITALS — BP 122/84 | HR 85 | Ht 73.0 in | Wt 306.0 lb

## 2024-03-15 DIAGNOSIS — E1065 Type 1 diabetes mellitus with hyperglycemia: Secondary | ICD-10-CM

## 2024-03-15 DIAGNOSIS — E7800 Pure hypercholesterolemia, unspecified: Secondary | ICD-10-CM

## 2024-03-15 DIAGNOSIS — Z9641 Presence of insulin pump (external) (internal): Secondary | ICD-10-CM

## 2024-03-15 LAB — POCT GLYCOSYLATED HEMOGLOBIN (HGB A1C): Hemoglobin A1C: 8.8 % — AB (ref 4.0–5.6)

## 2024-03-15 MED ORDER — OMNIPOD 5 DEXG7G6 PODS GEN 5 MISC: 1.0000 | 1 refills | Status: DC

## 2024-03-15 NOTE — Patient Instructions (Signed)

## 2024-03-15 NOTE — Progress Notes (Signed)
 Outpatient Endocrinology Note Daniel Newcomer, MD  03/15/24   Daniel Massey 1991/06/06 33  Referring Provider: Valli Gaw, MD Primary Care Provider: Valli Gaw, MD Reason for consultation: Subjective   Assessment & Plan  Diagnoses and all orders for this visit:  Uncontrolled type 1 diabetes mellitus with hyperglycemia (HCC) -     POCT glycosylated hemoglobin (Hb A1C) -     Ambulatory referral to diabetic education -     Lipid panel -     Microalbumin / creatinine urine ratio -     Comprehensive metabolic panel with GFR  Insulin  pump in place  Pure hypercholesterolemia  Other orders -     Insulin  Disposable Pump (OMNIPOD 5 DEXG7G6 PODS GEN 5) MISC; 1 Device by Does not apply route every other day.   Diabetes complicated by hyperglycemia  08/2204: GAD + 5.1, no c-peptide  Hba1c goal less than 7.0, current Hba1c is 7.7. Will recommend the following: OMNIPOD settings:  Basal rate: 1.4u/hr (1.1 between 4pm-7pm to prevent drops before supper) Bolus of 10 units for break fast and lunch and 13 units for supper Does not bolus for BF and lunch during working days, instructed to bolus 4 units on those days I/C 1: 1 (pt is not carb counting), ISF: 1:40, target 110 with correction over 130, IOB 4 hours 56/2/25: Instructed to bolus 15 min before meals: patient is under-bolusing carbs, only bolus 12-15 per meal, instructed to correctly carb counting and added DM education referral    No known contraindications to any of above medications Glucagon  -sent 03/27/23  Hyperlipidemia -Last LDL off goal: 119 -not on statin -Follow low fat diet and exercise   -Blood pressure goal <140/90 - Microalbumin/creatinine at goal < 30 - on ACE/ARB Olmesartan  20mg  qd -diet changes including salt restriction -limit eating outside -counseled BP targets per standards of diabetes care -Uncontrolled blood pressure can lead to retinopathy, nephropathy and cardiovascular and  atherosclerotic heart disease  Reviewed and counseled on: -A1C target -Blood sugar targets -Complications of uncontrolled diabetes  -Checking blood sugar before meals and bedtime and bring log next visit -All medications with mechanism of action and side effects -Hypoglycemia management: rule of 15's, Glucagon  Emergency Kit and medical alert ID -low-carb low-fat plate-method diet -At least 20 minutes of physical activity per day -Annual dilated retinal eye exam and foot exam -compliance and follow up needs -follow up as scheduled or earlier if problem gets worse  Call if blood sugar is less than 70 or consistently above 250    Take a 15 gm snack of carbohydrate at bedtime before you go to sleep if your blood sugar is less than 100.    If you are going to fast after midnight for a test or procedure, ask your physician for instructions on how to reduce/decrease your insulin  dose.    Call if blood sugar is less than 70 or consistently above 250  -Treating a low sugar by rule of 15  (15 gms of sugar every 15 min until sugar is more than 70) If you feel your sugar is low, test your sugar to be sure If your sugar is low (less than 70), then take 15 grams of a fast acting Carbohydrate (3-4 glucose tablets or glucose gel or 4 ounces of juice or regular soda) Recheck your sugar 15 min after treating low to make sure it is more than 70 If sugar is still less than 70, treat again with 15 grams of carbohydrate  Don't drive the hour of hypoglycemia  If unconscious/unable to eat or drink by mouth, use glucagon  injection or nasal spray baqsimi and call 911. Can repeat again in 15 min if still unconscious.  Return in about 4 weeks (around 04/12/2024).   I have reviewed current medications, nurse's notes, allergies, vital signs, past medical and surgical history, family medical history, and social history for this encounter. Counseled patient on symptoms, examination findings, lab findings,  imaging results, treatment decisions and monitoring and prognosis. The patient understood the recommendations and agrees with the treatment plan. All questions regarding treatment plan were fully answered.  Daniel Newcomer, MD  03/15/24   History of Present Illness Daniel Massey is a 33 y.o. year old male who presents for follow up of Type 1 diabetes mellitus.  [name] was first diagnosed in 33   Diabetes education +  Current diabetes regimen: OMNIPOD settings: Basal rate: 1.2u/hr, I/C 1: 1 (pt is not carb counting), ISF: 1:40, target 110 with correction over 130, IOB 4 hours Bolus of 10 units for break fast and lunch and 12 units for supper  Recent history:     Non-insulin  hypoglycemic drugs: Trulicity  3 mg weekly Previous insulin  regimen: Basaglar  50 units daily, NovoLog  10 units before meals    Previous history: Non-insulin  hypoglycemic drugs previously used: Metformin , Trulicity  since 10/22 Insulin  was started in 2012 after being on metformin  for about a year and lack of control  COMPLICATIONS -  MI/Stroke -  retinopathy, due eye exam -  neuropathy -  nephropathy  BLOOD SUGAR DATA  CGM interpretation: At today's visit, we reviewed her CGM downloads. The full report is scanned in the media. Reviewing the CGM trends, blood sugars are elevated before meals. 33  Physical Exam  BP 122/84   Pulse 85   Ht 6\' 1"  (1.854 m)   Wt (!) 306 lb (138.8 kg)   SpO2 98%   BMI 40.37 kg/m    Constitutional: well developed, well nourished Head: normocephalic, atraumatic Eyes: sclera anicteric, no redness Neck: supple Lungs: normal respiratory effort Neurology: alert and oriented Skin: dry, no appreciable rashes Musculoskeletal: no appreciable defects Psychiatric: normal mood and affect Diabetic Foot Exam - Simple   No data filed    Current Medications Patient's Medications  New Prescriptions   No medications on file  Previous Medications   AMLODIPINE -OLMESARTAN   (AZOR ) 10-20 MG TABLET    Take 1 tablet by mouth daily.   CONTINUOUS BLOOD GLUC RECEIVER (DEXCOM G6 RECEIVER) DEVI    Use to check blood sugar daily   CONTINUOUS GLUCOSE SENSOR (DEXCOM G7 SENSOR) MISC    1 Device by Does not apply route as directed. Change sensor every 10 days   CONTINUOUS GLUCOSE TRANSMITTER (DEXCOM G6 TRANSMITTER) MISC    Use as directed to check BS   GLUCAGON  (GVOKE HYPOPEN  1-PACK) 1 MG/0.2ML SOAJ    Inject 1 mg into the skin as needed (low blood sugar with impaired consciousness).   HYDROCORTISONE  2.5 % OINTMENT    Apply topically 2 (two) times daily as needed.   INSULIN  ASPART (NOVOLOG ) 100 UNIT/ML INJECTION    Up to 100 units via pump as needed   INSULIN  PEN NEEDLE (PEN NEEDLES) 30G X 8 MM MISC    1 each by Does not apply route daily. E11.9  Modified Medications   Modified Medication Previous Medication   INSULIN  DISPOSABLE PUMP (OMNIPOD 5 DEXG7G6 PODS GEN 5) MISC Insulin  Disposable Pump (OMNIPOD 5 DEXG7G6 PODS GEN 5) MISC  1 Device by Does not apply route every other day.    1 Device by Does not apply route every 3 (three) days.  Discontinued Medications   No medications on file    Allergies Allergies  Allergen Reactions   Penicillins     Just didn't work Has patient had a PCN reaction causing immediate rash, facial/tongue/throat swelling, SOB or lightheadedness with hypotension: unknown Has patient had a PCN reaction causing severe rash involving mucus membranes or skin necrosis: unknown Has patient had a PCN reaction that required hospitalization: Unknown Has patient had a PCN reaction occurring within the last 10 years: Unknown If all of the above answers are "NO", then may proceed with Cephalosporin use.     Past Medical History Past Medical History:  Diagnosis Date   Abscess of left hand 09/12/2015   Diabetes mellitus without complication (HCC)    Hypertension     Past Surgical History Past Surgical History:  Procedure Laterality Date   I & D  EXTREMITY Left 09/12/2015   Procedure: IRRIGATION AND DEBRIDEMENT LEFT RING FINGER ABSCESS;  Surgeon: Ronn Cohn, MD;  Location: [facility] OR;  Service: Orthopedics;  Laterality: Left;   ORIF ULNAR FRACTURE Right 08/2008    Family History family history includes Cancer (age of onset: 49) in his father; Diabetes in his mother and paternal grandfather; Hypertension in his father and mother.  Social History Social History   Socioeconomic History   Marital status: Married    Spouse name: [name]   Number of children: 0   Years of education: college   Highest education level: Bachelor's degree (e.g., BA, AB, BS)  Occupational History   Occupation: MLT    Comment: [employer]  Tobacco Use   Smoking status: Never   Smokeless tobacco: Never  Vaping Use   Vaping status: Never Used  Substance and Sexual Activity   Alcohol use: Yes    Alcohol/week: 2.0 standard drinks of alcohol    Types: 2 Standard drinks or equivalent per week   Drug use: No   Sexual activity: Yes    Partners: Female  Other Topics Concern   Not on file  Social History Narrative   Graduate of [employer] with a degree in Biology.   Lives with his girlfriend, Micheal Agent.   Family lives in [place], [place].   Social Drivers of Corporate investment banker Strain: Low Risk  (11/24/2023)   Overall Financial Resource Strain (CARDIA)    Difficulty of Paying Living Expenses: Not hard at all  Food Insecurity: No Food Insecurity (11/24/2023)   Hunger Vital Sign    Worried About Running Out of Food in the Last Year: Never true    Ran Out of Food in the Last Year: Never true  Transportation Needs: No Transportation Needs (11/24/2023)   PRAPARE - Administrator, Civil Service (Medical): No    Lack of Transportation (Non-Medical): No  Physical Activity: Insufficiently Active (11/24/2023)   Exercise Vital Sign    Days of Exercise per Week: 1 day    Minutes of Exercise per Session: 30 min  Stress: No Stress Concern Present  (11/24/2023)   [employer] of Occupational Health - Occupational Stress Questionnaire    Feeling of Stress : Not at all  Social Connections: Socially Integrated (11/24/2023)   Social Connection and Isolation Panel [NHANES]    Frequency of Communication with Friends and Family: Twice a week    Frequency of Social Gatherings with Friends and Family: Once a week  Attends Religious Services: More than 4 times per year    Active Member of Clubs or Organizations: Yes    Attends Banker Meetings: More than 4 times per year    Marital Status: Married  Intimate Partner Violence: Not on file    Lab Results  Component Value Date   HGBA1C 8.8 (A) 03/15/2024   HGBA1C 9.4 (A) 11/24/2023   HGBA1C 7.7 (A) 07/23/2023   Lab Results  Component Value Date   CHOL 198 03/25/2023   Lab Results  Component Value Date   HDL 56.60 03/25/2023   Lab Results  Component Value Date   LDLCALC 119 (H) 03/25/2023   Lab Results  Component Value Date   TRIG 114.0 03/25/2023   Lab Results  Component Value Date   CHOLHDL 4 03/25/2023   Lab Results  Component Value Date   CREATININE 0.81 11/24/2023   Lab Results  Component Value Date   GFR 116.47 11/24/2023   Lab Results  Component Value Date   MICROALBUR <0.7 11/24/2023      Component Value Date/Time   NA 137 11/24/2023 1558   K 3.8 11/24/2023 1558   CL 103 11/24/2023 1558   CO2 25 11/24/2023 1558   GLUCOSE 222 (H) 11/24/2023 1558   BUN 13 11/24/2023 1558   CREATININE 0.81 11/24/2023 1558   CREATININE 0.95 09/04/2014 1208   CALCIUM 9.4 11/24/2023 1558   PROT 7.6 11/24/2023 1558   ALBUMIN 4.3 11/24/2023 1558   AST 21 11/24/2023 1558   ALT 21 11/24/2023 1558   ALKPHOS 86 11/24/2023 1558   BILITOT 0.8 11/24/2023 1558   GFRNONAA 37 (L) 09/16/2015 0640   GFRNONAA >89 09/04/2014 1208   GFRAA 43 (L) 09/16/2015 0640   GFRAA >89 09/04/2014 1208      Latest Ref Rng & Units 11/24/2023    3:58 PM 03/25/2023    9:22 AM  08/14/2022    8:02 AM  BMP  Glucose 70 - 99 mg/dL 161  096  045   BUN 6 - 23 mg/dL 13  14  15    Creatinine 0.40 - 1.50 mg/dL 4.09  8.11  9.14   Sodium 135 - 145 mEq/L 137  136  137   Potassium 3.5 - 5.1 mEq/L 3.8  4.1  3.7   Chloride 96 - 112 mEq/L 103  101  102   CO2 19 - 32 mEq/L 25  26  27    Calcium 8.4 - 10.5 mg/dL 9.4  9.3  9.3        Component Value Date/Time   WBC 11.4 (A) 11/29/2015 1812   WBC 16.4 (H) 09/12/2015 0844   RBC 5.31 11/29/2015 1812   RBC 5.40 09/12/2015 0844   HGB 15.3 11/29/2015 1812   HGB 15.8 09/12/2015 0844   HCT 44.5 11/29/2015 1812   HCT 46.0 09/12/2015 0844   PLT 198 09/12/2015 0844   MCV 83.8 11/29/2015 1812   MCH 28.8 11/29/2015 1812   MCH 29.3 09/12/2015 0844   MCHC 34.4 11/29/2015 1812   MCHC 34.3 09/12/2015 0844   RDW 12.6 09/12/2015 0844   LYMPHSABS 2.4 09/12/2015 0844   MONOABS 1.2 (H) 09/12/2015 0844   EOSABS 0.1 09/12/2015 0844   BASOSABS 0.0 09/12/2015 0844     Parts of this note may have been dictated using voice recognition software. There may be variances in spelling and vocabulary which are unintentional. Not all errors are proofread. Please notify the [facility] if any discrepancies are noted or if the  meaning of any statement is not clear.

## 2024-03-16 ENCOUNTER — Telehealth: Payer: Self-pay | Admitting: Nutrition

## 2024-03-16 NOTE — Telephone Encounter (Signed)
 LVM to call me to schedule time to discuss why his blood sugars are running high and what we can do to fix this.  Gave times I am available to come in today or tomorrow, and number to call me back

## 2024-03-17 ENCOUNTER — Telehealth: Payer: Self-pay | Admitting: Nutrition

## 2024-03-17 NOTE — Telephone Encounter (Signed)
 LVM to call me to schedule time to discuss pump and high blood sugar readings. Left another voice mail to call me to schedule appt.

## 2024-03-20 ENCOUNTER — Encounter: Payer: Self-pay | Admitting: "Endocrinology

## 2024-03-20 DIAGNOSIS — E1165 Type 2 diabetes mellitus with hyperglycemia: Secondary | ICD-10-CM

## 2024-03-23 ENCOUNTER — Telehealth: Payer: Self-pay | Admitting: Nutrition

## 2024-03-23 ENCOUNTER — Other Ambulatory Visit: Payer: Self-pay

## 2024-03-23 DIAGNOSIS — E1165 Type 2 diabetes mellitus with hyperglycemia: Secondary | ICD-10-CM

## 2024-03-23 MED ORDER — DEXCOM G7 SENSOR MISC: 1.0000 | 0 refills | Status: DC

## 2024-03-23 NOTE — Telephone Encounter (Signed)
 LVM to call me to schedule visit.  Left times and dates for tomorrow and next week.

## 2024-03-23 NOTE — Telephone Encounter (Signed)
 Requested Prescriptions   Signed Prescriptions Disp Refills   Continuous Glucose Sensor (DEXCOM G7 SENSOR) MISC 12 each 0    Sig: 1 Device by Does not apply route as directed. Change sensor every 10 days    Authorizing Provider: Jorge Newcomer    Ordering User: Waneta Gut

## 2024-03-24 ENCOUNTER — Encounter: Attending: "Endocrinology | Admitting: Nutrition

## 2024-03-24 DIAGNOSIS — E108 Type 1 diabetes mellitus with unspecified complications: Secondary | ICD-10-CM | POA: Insufficient documentation

## 2024-03-24 NOTE — Progress Notes (Signed)
 Blood sugars much better this week than last week-56% TIR.   Pt. Reports that he has stopped drinking sweet drinks, is bolusing for all meals except lunch when working, and all snacks eaten.  He is not putting in blood sugar readings for all boluses.   Diet: he reports that he has reduced sweets and limited carbs per meal.  Diet history reveals that meals are balanced but not regular in amounts of carbs Works 12 hour shifts 7-7PM  Sometimes he puts carbs into carb box and sometimes he puts units of insulin .  Explained that he can only do one kind of bolus.  He agreed to count carbs.  We reveiwed this and he was given handouts for foods and carb amounts.  He is also using an app for this.  Discussed what carbs are, and he was shown 15 gram portion sizes for most of carbs he is eating.  He reports that blood sugar drop low after breakfast almost every day.  I/C ratio was set to 1u/20 carbs at  breakfast and 1u/15  for all other meals and snacks.   Discussed how protein and fats effect blood sugar and how to do an extended bolus when eating high fat.  He reported good understanding of this. Also discussed low blood sugar treatments and how to calculate amount of glucose needed based on IOB and current reading. He had no final questions.

## 2024-03-24 NOTE — Patient Instructions (Signed)
 Bolus for all meals and snacks Make sure to put in blood sugar readings for every bolus given Do correction doses on all blood sugars over 250. Change pod every 3 days.

## 2024-03-25 ENCOUNTER — Encounter: Payer: Self-pay | Admitting: "Endocrinology

## 2024-03-31 ENCOUNTER — Ambulatory Visit: Admitting: Urology

## 2024-04-14 ENCOUNTER — Ambulatory Visit (INDEPENDENT_AMBULATORY_CARE_PROVIDER_SITE_OTHER): Admitting: "Endocrinology

## 2024-04-14 ENCOUNTER — Encounter: Payer: Self-pay | Admitting: "Endocrinology

## 2024-04-14 VITALS — BP 132/80 | HR 75 | Ht 73.0 in | Wt 301.0 lb

## 2024-04-14 DIAGNOSIS — Z9641 Presence of insulin pump (external) (internal): Secondary | ICD-10-CM | POA: Diagnosis not present

## 2024-04-14 DIAGNOSIS — E1065 Type 1 diabetes mellitus with hyperglycemia: Secondary | ICD-10-CM | POA: Diagnosis not present

## 2024-04-14 DIAGNOSIS — E7800 Pure hypercholesterolemia, unspecified: Secondary | ICD-10-CM

## 2024-04-14 MED ORDER — DEXCOM G7 SENSOR MISC: 1.0000 | 2 refills | Status: AC

## 2024-04-14 NOTE — Patient Instructions (Signed)

## 2024-04-14 NOTE — Progress Notes (Signed)
 Outpatient Endocrinology Note Daniel Birmingham, MD  04/14/24   Daniel Massey [birth year] [id number]  Referring Provider: No ref. provider found Primary Care Provider: Hope Merle, MD Reason for consultation: Subjective   Assessment & Plan  Diagnoses and all orders for this visit:  Uncontrolled type 1 diabetes mellitus with hyperglycemia (HCC) -     Continuous Glucose Sensor (DEXCOM G7 SENSOR) MISC; 1 Device by Does not apply route as directed. Change sensor every 10 days  Uncontrolled type 2 diabetes mellitus with hyperglycemia (HCC)  Insulin  pump in place  Pure hypercholesterolemia   Diabetes complicated by hyperglycemia  08/2204: GAD + 5.1, no c-peptide  Hba1c goal less than 7.0, current Hba1c is 7.7. Will recommend the following: OMNIPOD settings with Novolog  and DexCom G7:  Basal rate:  12am-9am 1.6 9am-4pm 1.4 4pm-7pm 1.1 7pm-12am 1.5  IC  12am-10;30am 1:20 10:30-12am 1:10  ISF: 1:40, target 110 with correction over 130, IOB 4 hours  03/15/24: Instructed to bolus 15 min before meals: patient is under-bolusing carbs, only bolus 12-15 per meal, instructed to correctly carb counting and added DM education referral    No known contraindications to any of above medications Glucagon  -sent 03/27/23  Hyperlipidemia -Last LDL off goal: 119 -not on statin -Follow low fat diet and exercise   -Blood pressure goal <140/90 - Microalbumin/creatinine at goal < 30 - on ACE/ARB Olmesartan  20mg  qd -diet changes including salt restriction -limit eating outside -counseled BP targets per standards of diabetes care -Uncontrolled blood pressure can lead to retinopathy, nephropathy and cardiovascular and atherosclerotic heart disease  Reviewed and counseled on: -A1C target -Blood sugar targets -Complications of uncontrolled diabetes  -Checking blood sugar before meals and bedtime and bring log next visit -All medications with mechanism of action and side  effects -Hypoglycemia management: rule of 15's, Glucagon  Emergency Kit and medical alert ID -low-carb low-fat plate-method diet -At least 20 minutes of physical activity per day -Annual dilated retinal eye exam and foot exam -compliance and follow up needs -follow up as scheduled or earlier if problem gets worse  Call if blood sugar is less than 70 or consistently above 250    Take a 15 gm snack of carbohydrate at bedtime before you go to sleep if your blood sugar is less than 100.    If you are going to fast after midnight for a test or procedure, ask your physician for instructions on how to reduce/decrease your insulin  dose.    Call if blood sugar is less than 70 or consistently above 250  -Treating a low sugar by rule of 15  (15 gms of sugar every 15 min until sugar is more than 70) If you feel your sugar is low, test your sugar to be sure If your sugar is low (less than 70), then take 15 grams of a fast acting Carbohydrate (3-4 glucose tablets or glucose gel or 4 ounces of juice or regular soda) Recheck your sugar 15 min after treating low to make sure it is more than 70 If sugar is still less than 70, treat again with 15 grams of carbohydrate          Don't drive the hour of hypoglycemia  If unconscious/unable to eat or drink by mouth, use glucagon  injection or nasal spray baqsimi and call 911. Can repeat again in 15 min if still unconscious.  Return in about 3 months (around 07/15/2024).   I have reviewed current medications, nurse's notes, allergies, vital signs, past medical and surgical history,  family medical history, and social history for this encounter. Counseled patient on symptoms, examination findings, lab findings, imaging results, treatment decisions and monitoring and prognosis. The patient understood the recommendations and agrees with the treatment plan. All questions regarding treatment plan were fully answered.  Daniel Birmingham, MD  04/14/24   History of Present  Illness Daniel Massey is a 33 y.o. year old male who presents for follow up of Type 1 diabetes mellitus.  Daniel Massey was first diagnosed in 2011.   Diabetes education +  Current diabetes regimen: OMNIPOD settings with Novolog  Basal rate:  12am-4pm 1.4 4pm-7pm 1.1 7pm-12am 1.5  IC  12am-10;30am 1:20 10:30-12am 1:10  ISF: 1:40, target 110 with correction over 130, IOB 4 hours  Recent history:     Non-insulin  hypoglycemic drugs: Trulicity  3 mg weekly Previous insulin  regimen: Basaglar  50 units daily, NovoLog  10 units before meals    Previous history: Non-insulin  hypoglycemic drugs previously used: Metformin , Trulicity  since 10/22 Insulin  was started in 2012 after being on metformin  for about a year and lack of control  COMPLICATIONS -  MI/Stroke -  retinopathy, due eye exam -  neuropathy -  nephropathy  BLOOD SUGAR DATA CGM interpretation: At today's visit, we reviewed her CGM downloads. The full report is scanned in the media. Reviewing the CGM trends, blood sugars are elevated overnight and well controlled across the day/before meals.  Physical Exam  BP 132/80   Pulse 75   Ht 6' 1 (1.854 m)   Wt (!) 301 lb (136.5 kg)   SpO2 98%   BMI 39.71 kg/m    Constitutional: well developed, well nourished Head: normocephalic, atraumatic Eyes: sclera anicteric, no redness Neck: supple Lungs: normal respiratory effort Neurology: alert and oriented Skin: dry, no appreciable rashes Musculoskeletal: no appreciable defects Psychiatric: normal mood and affect Diabetic Foot Exam - Simple   Simple Foot Form Diabetic Foot exam was performed with the following findings: Yes 04/14/2024  1:58 PM  Visual Inspection No deformities, no ulcerations, no other skin breakdown bilaterally: Yes Sensation Testing Intact to touch and monofilament testing bilaterally: Yes Pulse Check Posterior Tibialis and Dorsalis pulse intact bilaterally: Yes Comments    Current  Medications Patient's Medications  New Prescriptions   No medications on file  Previous Medications   AMLODIPINE -OLMESARTAN  (AZOR ) 10-20 MG TABLET    Take 1 tablet by mouth daily.   CONTINUOUS GLUCOSE TRANSMITTER (DEXCOM G6 TRANSMITTER) MISC    Use as directed to check BS   GLUCAGON  (GVOKE HYPOPEN  1-PACK) 1 MG/0.2ML SOAJ    Inject 1 mg into the skin as needed (low blood sugar with impaired consciousness).   HYDROCORTISONE  2.5 % OINTMENT    Apply topically 2 (two) times daily as needed.   INSULIN  ASPART (NOVOLOG ) 100 UNIT/ML INJECTION    Up to 100 units via pump as needed   INSULIN  DISPOSABLE PUMP (OMNIPOD 5 DEXG7G6 PODS GEN 5) MISC    1 Device by Does not apply route every other day.   INSULIN  PEN NEEDLE (PEN NEEDLES) 30G X 8 MM MISC    1 each by Does not apply route daily. E11.9  Modified Medications   Modified Medication Previous Medication   CONTINUOUS GLUCOSE SENSOR (DEXCOM G7 SENSOR) MISC Continuous Glucose Sensor (DEXCOM G7 SENSOR) MISC      1 Device by Does not apply route as directed. Change sensor every 10 days    1 Device by Does not apply route as directed. Change sensor every 10 days  Discontinued Medications  CONTINUOUS BLOOD GLUC RECEIVER (DEXCOM G6 RECEIVER) DEVI    Use to check blood sugar daily    Allergies Allergies  Allergen Reactions   Penicillins     Just didn't work Has patient had a PCN reaction causing immediate rash, facial/tongue/throat swelling, SOB or lightheadedness with hypotension: unknown Has patient had a PCN reaction causing severe rash involving mucus membranes or skin necrosis: unknown Has patient had a PCN reaction that required hospitalization: Unknown Has patient had a PCN reaction occurring within the last 10 years: Unknown If all of the above answers are NO, then may proceed with Cephalosporin use.     Past Medical History Past Medical History:  Diagnosis Date   Abscess of left hand 09/12/2015   Diabetes mellitus without complication  (HCC)    Hypertension     Past Surgical History Past Surgical History:  Procedure Laterality Date   I & D EXTREMITY Left 09/12/2015   Procedure: IRRIGATION AND DEBRIDEMENT LEFT RING FINGER ABSCESS;  Surgeon: Elsie Mussel, MD;  Location: [facility] OR;  Service: Orthopedics;  Laterality: Left;   ORIF ULNAR FRACTURE Right 08/2008    Family History family history includes Cancer (age of onset: 80) in his father; Diabetes in his mother and paternal grandfather; Hypertension in his father and mother.  Social History Social History   Socioeconomic History   Marital status: Married    Spouse name: [name]   Number of children: 0   Years of education: college   Highest education level: Bachelor's degree (e.g., BA, AB, BS)  Occupational History   Occupation: MLT    Comment: [employer]  Tobacco Use   Smoking status: Never   Smokeless tobacco: Never  Vaping Use   Vaping status: Never Used  Substance and Sexual Activity   Alcohol use: Yes    Alcohol/week: 2.0 standard drinks of alcohol    Types: 2 Standard drinks or equivalent per week   Drug use: No   Sexual activity: Yes    Partners: Female  Other Topics Concern   Not on file  Social History Narrative   Graduate of [employer] with a degree in Biology.   Lives with his girlfriend, Manuel.   Family lives in [place], [place].   Social Drivers of Corporate investment banker Strain: Low Risk  (11/24/2023)   Overall Financial Resource Strain (CARDIA)    Difficulty of Paying Living Expenses: Not hard at all  Food Insecurity: No Food Insecurity (11/24/2023)   Hunger Vital Sign    Worried About Running Out of Food in the Last Year: Never true    Ran Out of Food in the Last Year: Never true  Transportation Needs: No Transportation Needs (11/24/2023)   PRAPARE - Administrator, Civil Service (Medical): No    Lack of Transportation (Non-Medical): No  Physical Activity: Insufficiently Active (11/24/2023)   Exercise Vital Sign     Days of Exercise per Week: 1 day    Minutes of Exercise per Session: 30 min  Stress: No Stress Concern Present (11/24/2023)   [employer] of Occupational Health - Occupational Stress Questionnaire    Feeling of Stress : Not at all  Social Connections: Socially Integrated (11/24/2023)   Social Connection and Isolation Panel    Frequency of Communication with Friends and Family: Twice a week    Frequency of Social Gatherings with Friends and Family: Once a week    Attends Religious Services: More than 4 times per year    Active Member of [employer]  or Organizations: Yes    Attends Banker Meetings: More than 4 times per year    Marital Status: Married  Intimate Partner Violence: Not on file    Lab Results  Component Value Date   HGBA1C 8.8 (A) 03/15/2024   HGBA1C 9.4 (A) 11/24/2023   HGBA1C 7.7 (A) 07/23/2023   Lab Results  Component Value Date   CHOL 198 03/25/2023   Lab Results  Component Value Date   HDL 56.60 03/25/2023   Lab Results  Component Value Date   LDLCALC 119 (H) 03/25/2023   Lab Results  Component Value Date   TRIG 114.0 03/25/2023   Lab Results  Component Value Date   CHOLHDL 4 03/25/2023   Lab Results  Component Value Date   CREATININE 0.81 11/24/2023   Lab Results  Component Value Date   GFR 116.47 11/24/2023   Lab Results  Component Value Date   MICROALBUR <0.7 11/24/2023      Component Value Date/Time   NA 137 11/24/2023 1558   K 3.8 11/24/2023 1558   CL 103 11/24/2023 1558   CO2 25 11/24/2023 1558   GLUCOSE 222 (H) 11/24/2023 1558   BUN 13 11/24/2023 1558   CREATININE 0.81 11/24/2023 1558   CREATININE 0.95 09/04/2014 1208   CALCIUM 9.4 11/24/2023 1558   PROT 7.6 11/24/2023 1558   ALBUMIN 4.3 11/24/2023 1558   AST 21 11/24/2023 1558   ALT 21 11/24/2023 1558   ALKPHOS 86 11/24/2023 1558   BILITOT 0.8 11/24/2023 1558   GFRNONAA 37 (L) 09/16/2015 0640   GFRNONAA >89 09/04/2014 1208   GFRAA 43 (L) 09/16/2015 0640    GFRAA >89 09/04/2014 1208      Latest Ref Rng & Units 11/24/2023    3:58 PM 03/25/2023    9:22 AM 08/14/2022    8:02 AM  BMP  Glucose 70 - 99 mg/dL 777  824  848   BUN 6 - 23 mg/dL 13  14  15    Creatinine 0.40 - 1.50 mg/dL 9.18  9.06  9.17   Sodium 135 - 145 mEq/L 137  136  137   Potassium 3.5 - 5.1 mEq/L 3.8  4.1  3.7   Chloride 96 - 112 mEq/L 103  101  102   CO2 19 - 32 mEq/L 25  26  27    Calcium 8.4 - 10.5 mg/dL 9.4  9.3  9.3        Component Value Date/Time   WBC 11.4 (A) 11/29/2015 1812   WBC 16.4 (H) 09/12/2015 0844   RBC 5.31 11/29/2015 1812   RBC 5.40 09/12/2015 0844   HGB 15.3 11/29/2015 1812   HGB 15.8 09/12/2015 0844   HCT 44.5 11/29/2015 1812   HCT 46.0 09/12/2015 0844   PLT 198 09/12/2015 0844   MCV 83.8 11/29/2015 1812   MCH 28.8 11/29/2015 1812   MCH 29.3 09/12/2015 0844   MCHC 34.4 11/29/2015 1812   MCHC 34.3 09/12/2015 0844   RDW 12.6 09/12/2015 0844   LYMPHSABS 2.4 09/12/2015 0844   MONOABS 1.2 (H) 09/12/2015 0844   EOSABS 0.1 09/12/2015 0844   BASOSABS 0.0 09/12/2015 0844     Parts of this note may have been dictated using voice recognition software. There may be variances in spelling and vocabulary which are unintentional. Not all errors are proofread. Please notify the dino if any discrepancies are noted or if the meaning of any statement is not clear.

## 2024-04-28 ENCOUNTER — Ambulatory Visit: Admitting: Urology

## 2024-04-28 ENCOUNTER — Encounter: Payer: Self-pay | Admitting: Urology

## 2024-04-28 VITALS — BP 119/83 | HR 88 | Ht 72.0 in | Wt 295.0 lb

## 2024-04-28 DIAGNOSIS — R868 Other abnormal findings in specimens from male genital organs: Secondary | ICD-10-CM | POA: Diagnosis not present

## 2024-04-28 NOTE — Progress Notes (Signed)
 I, Daniel Massey, acting as a scribe for Daniel JAYSON Barba, MD., have documented all relevant documentation on the behalf of Daniel JAYSON Barba, MD, as directed by Daniel JAYSON Barba, MD while in the presence of Daniel JAYSON Barba, MD.  04/28/2024 5:22 PM   Daniel Massey 1991/10/05 [id number]  Referring provider: Watt Bernarda NOVAK, PA-C [address] [place],  [place] [address]  Chief Complaint  Patient presents with   Other    HPI: Daniel Massey is a 33 y.o. male referred for evaluation of a low semen volume.   His wife was undergoing a fertility evaluation by gynecology, and as part of that workup a semen analysis was ordered on her husband, which showed normal count and motility, but a low volume of 0.7 cc. He states he has no problems with ejaculation and was not aware of any problem that he had. He had no pain with ejaculation, pelvic pain, gross hematuria, urethral discharge, orr pelvic pain His wife has subsequently become pregnant.    PMH: Past Medical History:  Diagnosis Date   Abscess of left hand 09/12/2015   Diabetes mellitus without complication (HCC)    Hypertension     Surgical History: Past Surgical History:  Procedure Laterality Date   I & D EXTREMITY Left 09/12/2015   Procedure: IRRIGATION AND DEBRIDEMENT LEFT RING FINGER ABSCESS;  Surgeon: Daniel Mussel, MD;  Location: [facility] OR;  Service: Orthopedics;  Laterality: Left;   ORIF ULNAR FRACTURE Right 08/2008    Home Medications:  Allergies as of 04/28/2024       Reactions   Penicillins    Just didn't work Has patient had a PCN reaction causing immediate rash, facial/tongue/throat swelling, SOB or lightheadedness with hypotension: unknown Has patient had a PCN reaction causing severe rash involving mucus membranes or skin necrosis: unknown Has patient had a PCN reaction that required hospitalization: Unknown Has patient had a PCN reaction occurring within the last 10 years: Unknown If all of the above  answers are NO, then may proceed with Cephalosporin use.        Medication List        Accurate as of April 28, 2024  5:22 PM. If you have any questions, ask your nurse or doctor.          amlodipine -olmesartan  10-20 MG tablet Commonly known as: AZOR  Take 1 tablet by mouth daily.   Dexcom G6 Transmitter Misc Use as directed to check BS   Dexcom G7 Sensor Misc 1 Device by Does not apply route as directed. Change sensor every 10 days   Gvoke HypoPen  1-Pack 1 MG/0.2ML Soaj Generic drug: Glucagon  Inject 1 mg into the skin as needed (low blood sugar with impaired consciousness).   hydrocortisone  2.5 % ointment Apply topically 2 (two) times daily as needed.   insulin  aspart 100 UNIT/ML injection Commonly known as: NovoLOG  Up to 100 units via pump as needed   Omnipod 5 DexG7G6 Pods Gen 5 Misc 1 Device by Does not apply route every other day.   Pen Needles 30G X 8 MM Misc 1 each by Does not apply route daily. E11.9        Allergies:  Allergies  Allergen Reactions   Penicillins     Just didn't work Has patient had a PCN reaction causing immediate rash, facial/tongue/throat swelling, SOB or lightheadedness with hypotension: unknown Has patient had a PCN reaction causing severe rash involving mucus membranes or skin necrosis: unknown Has patient had a PCN reaction that  required hospitalization: Unknown Has patient had a PCN reaction occurring within the last 10 years: Unknown If all of the above answers are NO, then may proceed with Cephalosporin use.     Family History: Family History  Problem Relation Age of Onset   Diabetes Mother    Hypertension Mother    Cancer Father 54       colon   Hypertension Father    Diabetes Paternal Grandfather     Social History:  reports that he has never smoked. He has never used smokeless tobacco. He reports current alcohol use of about 2.0 standard drinks of alcohol per week. He reports that he does not use  drugs.   Physical Exam: BP 119/83   Pulse 88   Ht 6' (1.829 m)   Wt 295 lb (133.8 kg)   BMI 40.01 kg/m   Constitutional:  Alert and oriented, No acute distress. HEENT: Daniel Massey AT, moist mucus membranes.  Trachea midline, no masses. Cardiovascular: No clubbing, cyanosis, or edema. Respiratory: Normal respiratory effort, no increased work of breathing. GI: Abdomen is soft, nontender, nondistended, no abdominal masses Skin: No rashes, bruises or suspicious lesions. Neurologic: Grossly intact, no focal deficits, moving all 4 extremities. Psychiatric: Normal mood and affect.  Assessment & Plan:    1. Low Semen Volume Asymptomatic; semen analysis was part of a fertility evaluation. No further evaluation recommended as his wife is pregnant. If future conception difficulties arise, referral to Dr. Lovie at [facility] in [place] for further fertility evaluation is advised.  I have reviewed the above documentation for accuracy and completeness, and I agree with the above.   Daniel JAYSON Barba, MD  [facility] [address] [place], [place] [address] [phone]

## 2024-05-10 ENCOUNTER — Ambulatory Visit: Admitting: Dietician

## 2024-05-19 ENCOUNTER — Telehealth: Payer: Self-pay

## 2024-05-19 NOTE — Telephone Encounter (Signed)
 Patient was called to see if he could do a TOC on 06/30/2024. Patient stated that he could not. Patient was offered 07/06/24 @ !pm. Patient said something and then line went dead. Tried calling patient back 2x, patient would not pick up. Appointment was scheduled for 07/06/2024 @ 1pm and sent to MyChart.

## 2024-07-06 ENCOUNTER — Encounter

## 2024-07-15 ENCOUNTER — Ambulatory Visit (INDEPENDENT_AMBULATORY_CARE_PROVIDER_SITE_OTHER): Admitting: "Endocrinology

## 2024-07-15 ENCOUNTER — Encounter: Payer: Self-pay | Admitting: "Endocrinology

## 2024-07-15 VITALS — BP 128/86 | HR 97 | Resp 16 | Ht 72.0 in | Wt 299.2 lb

## 2024-07-15 DIAGNOSIS — E1065 Type 1 diabetes mellitus with hyperglycemia: Secondary | ICD-10-CM

## 2024-07-15 DIAGNOSIS — Z9641 Presence of insulin pump (external) (internal): Secondary | ICD-10-CM

## 2024-07-15 DIAGNOSIS — E7800 Pure hypercholesterolemia, unspecified: Secondary | ICD-10-CM

## 2024-07-15 DIAGNOSIS — Z4681 Encounter for fitting and adjustment of insulin pump: Secondary | ICD-10-CM | POA: Diagnosis not present

## 2024-07-15 LAB — POCT GLYCOSYLATED HEMOGLOBIN (HGB A1C): Hemoglobin A1C: 8.3 % — AB (ref 4.0–5.6)

## 2024-07-15 NOTE — Progress Notes (Signed)
 Outpatient Endocrinology Note Daniel Birmingham, MD  07/15/24   Daniel Massey [birth year] [id number]  Referring Provider: Hope Merle, MD Primary Care Provider: Patient, No Pcp Per Reason for consultation: Subjective   Assessment & Plan  Diagnoses and all orders for this visit:  Uncontrolled type 1 diabetes mellitus with hyperglycemia (HCC) -     POCT glycosylated hemoglobin (Hb A1C)  Insulin  pump in place  Insulin  pump fitting or adjustment  Pure hypercholesterolemia   Diabetes complicated by hyperglycemia  08/2204: GAD + 5.1, no c-peptide  Hba1c goal less than 7.0, current Hba1c  Lab Results  Component Value Date   HGBA1C 8.3 (A) 07/15/2024   HGBA1C 8.8 (A) 03/15/2024   HGBA1C 9.4 (A) 11/24/2023    Will recommend the following: OMNIPOD settings with Novolog  and DexCom G7:  Basal rate:  12am-9am 1.6 9am-4pm   1.4 4pm-7pm   1.1 7pm-12am 1.5  IC  12am-10:30am 1:24 10:30-3pm 1:8 10:30-3pm 1:7  ISF: 1:40, target 110 with correction over 130, IOB 4 hours  03/15/24: Instructed to bolus 15 min before meals: patient is under-bolusing carbs, only bolus 12-15 per meal, instructed to correctly carb counting and added DM education referral    No known contraindications to any of above medications Glucagon  -sent 03/27/23  Hyperlipidemia -Last LDL off goal: 119 -not on statin -Follow low fat diet and exercise   -Blood pressure goal <140/90 - Microalbumin/creatinine at goal < 30 - on ACE/ARB Olmesartan  20mg  qd -diet changes including salt restriction -limit eating outside -counseled BP targets per standards of diabetes care -Uncontrolled blood pressure can lead to retinopathy, nephropathy and cardiovascular and atherosclerotic heart disease  Reviewed and counseled on: -A1C target -Blood sugar targets -Complications of uncontrolled diabetes  -Checking blood sugar before meals and bedtime and bring log next visit -All medications with mechanism of action  and side effects -Hypoglycemia management: rule of 15's, Glucagon  Emergency Kit and medical alert ID -low-carb low-fat plate-method diet -At least 20 minutes of physical activity per day -Annual dilated retinal eye exam and foot exam -compliance and follow up needs -follow up as scheduled or earlier if problem gets worse  Call if blood sugar is less than 70 or consistently above 250    Take a 15 gm snack of carbohydrate at bedtime before you go to sleep if your blood sugar is less than 100.    If you are going to fast after midnight for a test or procedure, ask your physician for instructions on how to reduce/decrease your insulin  dose.    Call if blood sugar is less than 70 or consistently above 250  -Treating a low sugar by rule of 15  (15 gms of sugar every 15 min until sugar is more than 70) If you feel your sugar is low, test your sugar to be sure If your sugar is low (less than 70), then take 15 grams of a fast acting Carbohydrate (3-4 glucose tablets or glucose gel or 4 ounces of juice or regular soda) Recheck your sugar 15 min after treating low to make sure it is more than 70 If sugar is still less than 70, treat again with 15 grams of carbohydrate          Don't drive the hour of hypoglycemia  If unconscious/unable to eat or drink by mouth, use glucagon  injection or nasal spray baqsimi and call 911. Can repeat again in 15 min if still unconscious.  Return in about 2 months (around 09/14/2024).   I have  reviewed current medications, nurse's notes, allergies, vital signs, past medical and surgical history, family medical history, and social history for this encounter. Counseled patient on symptoms, examination findings, lab findings, imaging results, treatment decisions and monitoring and prognosis. The patient understood the recommendations and agrees with the treatment plan. All questions regarding treatment plan were fully answered.  Daniel Birmingham, MD  07/15/24   History of  Present Illness Daniel Massey is a 33 y.o. year old male who presents for follow up of Type 1 diabetes mellitus.  Daniel Massey was first diagnosed in 2011.   Diabetes education +  Current diabetes regimen: OMNIPOD settings with Novolog  Basal rate:  12am-9am 1.6 9am-4pm   1.4 4pm-7pm   1.1 7pm-12am 1.5  IC  12am-10:30am 1:20 10:30-12am 1:10  ISF: 1:40, target 110 with correction over 130, IOB 4 hours  Recent history:     Non-insulin  hypoglycemic drugs: Trulicity  3 mg weekly Previous insulin  regimen: Basaglar  50 units daily, NovoLog  10 units before meals    Previous history: Non-insulin  hypoglycemic drugs previously used: Metformin , Trulicity  since 10/22 Insulin  was started in 2012 after being on metformin  for about a year and lack of control  COMPLICATIONS -  MI/Stroke -  retinopathy, due eye exam -  neuropathy -  nephropathy  BLOOD SUGAR DATA CGM interpretation: At today's visit, we reviewed her CGM downloads. The full report is scanned in the media. Reviewing the CGM trends, blood sugars are elevated after lunch and supper can drop below before lunch.  Physical Exam  BP 128/86   Pulse 97   Resp 16   Ht 6' (1.829 m)   Wt 299 lb 3.2 oz (135.7 kg)   SpO2 98%   BMI 40.58 kg/m    Constitutional: well developed, well nourished Head: normocephalic, atraumatic Eyes: sclera anicteric, no redness Neck: supple Lungs: normal respiratory effort Neurology: alert and oriented Skin: dry, no appreciable rashes Musculoskeletal: no appreciable defects Psychiatric: normal mood and affect Diabetic Foot Exam - Simple   No data filed    Current Medications Patient's Medications  New Prescriptions   No medications on file  Previous Medications   AMLODIPINE -OLMESARTAN  (AZOR ) 10-20 MG TABLET    Take 1 tablet by mouth daily.   CONTINUOUS GLUCOSE SENSOR (DEXCOM G7 SENSOR) MISC    1 Device by Does not apply route as directed. Change sensor every 10 days   CONTINUOUS  GLUCOSE TRANSMITTER (DEXCOM G6 TRANSMITTER) MISC    Use as directed to check BS   GLUCAGON  (GVOKE HYPOPEN  1-PACK) 1 MG/0.2ML SOAJ    Inject 1 mg into the skin as needed (low blood sugar with impaired consciousness).   HYDROCORTISONE  2.5 % OINTMENT    Apply topically 2 (two) times daily as needed.   INSULIN  ASPART (NOVOLOG ) 100 UNIT/ML INJECTION    Up to 100 units via pump as needed   INSULIN  DISPOSABLE PUMP (OMNIPOD 5 DEXG7G6 PODS GEN 5) MISC    1 Device by Does not apply route every other day.   INSULIN  PEN NEEDLE (PEN NEEDLES) 30G X 8 MM MISC    1 each by Does not apply route daily. E11.9  Modified Medications   No medications on file  Discontinued Medications   No medications on file    Allergies Allergies  Allergen Reactions   Penicillins     Just didn't work Has patient had a PCN reaction causing immediate rash, facial/tongue/throat swelling, SOB or lightheadedness with hypotension: unknown Has patient had a PCN reaction causing severe rash involving mucus  membranes or skin necrosis: unknown Has patient had a PCN reaction that required hospitalization: Unknown Has patient had a PCN reaction occurring within the last 10 years: Unknown If all of the above answers are NO, then may proceed with Cephalosporin use.     Past Medical History Past Medical History:  Diagnosis Date   Abscess of left hand 09/12/2015   Diabetes mellitus without complication (HCC)    Hypertension     Past Surgical History Past Surgical History:  Procedure Laterality Date   I & D EXTREMITY Left 09/12/2015   Procedure: IRRIGATION AND DEBRIDEMENT LEFT RING FINGER ABSCESS;  Surgeon: Elsie Mussel, MD;  Location: [facility] OR;  Service: Orthopedics;  Laterality: Left;   ORIF ULNAR FRACTURE Right 08/2008    Family History family history includes Cancer (age of onset: 54) in his father; Diabetes in his mother and paternal grandfather; Hypertension in his father and mother.  Social History Social History    Socioeconomic History   Marital status: Married    Spouse name: [name]   Number of children: 0   Years of education: college   Highest education level: Bachelor's degree (e.g., BA, AB, BS)  Occupational History   Occupation: MLT    Comment: [employer]  Tobacco Use   Smoking status: Never   Smokeless tobacco: Never  Vaping Use   Vaping status: Never Used  Substance and Sexual Activity   Alcohol use: Yes    Alcohol/week: 2.0 standard drinks of alcohol    Types: 2 Standard drinks or equivalent per week   Drug use: No   Sexual activity: Yes    Partners: Female  Other Topics Concern   Not on file  Social History Narrative   Graduate of [employer] with a degree in Biology.   Lives with his girlfriend, Manuel.   Family lives in [place], [place].   Social Drivers of Corporate investment banker Strain: Low Risk  (11/24/2023)   Overall Financial Resource Strain (CARDIA)    Difficulty of Paying Living Expenses: Not hard at all  Food Insecurity: No Food Insecurity (11/24/2023)   Hunger Vital Sign    Worried About Running Out of Food in the Last Year: Never true    Ran Out of Food in the Last Year: Never true  Transportation Needs: No Transportation Needs (11/24/2023)   PRAPARE - Administrator, Civil Service (Medical): No    Lack of Transportation (Non-Medical): No  Physical Activity: Insufficiently Active (11/24/2023)   Exercise Vital Sign    Days of Exercise per Week: 1 day    Minutes of Exercise per Session: 30 min  Stress: No Stress Concern Present (11/24/2023)   [employer] of Occupational Health - Occupational Stress Questionnaire    Feeling of Stress : Not at all  Social Connections: Socially Integrated (11/24/2023)   Social Connection and Isolation Panel    Frequency of Communication with Friends and Family: Twice a week    Frequency of Social Gatherings with Friends and Family: Once a week    Attends Religious Services: More than 4 times per year     Active Member of Clubs or Organizations: Yes    Attends Banker Meetings: More than 4 times per year    Marital Status: Married  Catering manager Violence: Not on file    Lab Results  Component Value Date   HGBA1C 8.3 (A) 07/15/2024   HGBA1C 8.8 (A) 03/15/2024   HGBA1C 9.4 (A) 11/24/2023   Lab Results  Component  Value Date   CHOL 198 03/25/2023   Lab Results  Component Value Date   HDL 56.60 03/25/2023   Lab Results  Component Value Date   LDLCALC 119 (H) 03/25/2023   Lab Results  Component Value Date   TRIG 114.0 03/25/2023   Lab Results  Component Value Date   CHOLHDL 4 03/25/2023   Lab Results  Component Value Date   CREATININE 0.81 11/24/2023   Lab Results  Component Value Date   GFR 116.47 11/24/2023   Lab Results  Component Value Date   MICROALBUR <0.7 11/24/2023      Component Value Date/Time   NA 137 11/24/2023 1558   K 3.8 11/24/2023 1558   CL 103 11/24/2023 1558   CO2 25 11/24/2023 1558   GLUCOSE 222 (H) 11/24/2023 1558   BUN 13 11/24/2023 1558   CREATININE 0.81 11/24/2023 1558   CREATININE 0.95 09/04/2014 1208   CALCIUM 9.4 11/24/2023 1558   PROT 7.6 11/24/2023 1558   ALBUMIN 4.3 11/24/2023 1558   AST 21 11/24/2023 1558   ALT 21 11/24/2023 1558   ALKPHOS 86 11/24/2023 1558   BILITOT 0.8 11/24/2023 1558   GFRNONAA 37 (L) 09/16/2015 0640   GFRNONAA >89 09/04/2014 1208   GFRAA 43 (L) 09/16/2015 0640   GFRAA >89 09/04/2014 1208      Latest Ref Rng & Units 11/24/2023    3:58 PM 03/25/2023    9:22 AM 08/14/2022    8:02 AM  BMP  Glucose 70 - 99 mg/dL 777  824  848   BUN 6 - 23 mg/dL 13  14  15    Creatinine 0.40 - 1.50 mg/dL 9.18  9.06  9.17   Sodium 135 - 145 mEq/L 137  136  137   Potassium 3.5 - 5.1 mEq/L 3.8  4.1  3.7   Chloride 96 - 112 mEq/L 103  101  102   CO2 19 - 32 mEq/L 25  26  27    Calcium 8.4 - 10.5 mg/dL 9.4  9.3  9.3        Component Value Date/Time   WBC 11.4 (A) 11/29/2015 1812   WBC 16.4 (H) 09/12/2015  0844   RBC 5.31 11/29/2015 1812   RBC 5.40 09/12/2015 0844   HGB 15.3 11/29/2015 1812   HGB 15.8 09/12/2015 0844   HCT 44.5 11/29/2015 1812   HCT 46.0 09/12/2015 0844   PLT 198 09/12/2015 0844   MCV 83.8 11/29/2015 1812   MCH 28.8 11/29/2015 1812   MCH 29.3 09/12/2015 0844   MCHC 34.4 11/29/2015 1812   MCHC 34.3 09/12/2015 0844   RDW 12.6 09/12/2015 0844   LYMPHSABS 2.4 09/12/2015 0844   MONOABS 1.2 (H) 09/12/2015 0844   EOSABS 0.1 09/12/2015 0844   BASOSABS 0.0 09/12/2015 0844     Parts of this note may have been dictated using voice recognition software. There may be variances in spelling and vocabulary which are unintentional. Not all errors are proofread. Please notify the dino if any discrepancies are noted or if the meaning of any statement is not clear.

## 2024-07-15 NOTE — Patient Instructions (Signed)

## 2024-09-15 ENCOUNTER — Ambulatory Visit: Admitting: "Endocrinology

## 2024-09-15 ENCOUNTER — Encounter: Payer: Self-pay | Admitting: "Endocrinology

## 2024-09-15 VITALS — BP 130/80 | HR 87 | Ht 72.0 in | Wt 307.0 lb

## 2024-09-15 DIAGNOSIS — Z4681 Encounter for fitting and adjustment of insulin pump: Secondary | ICD-10-CM

## 2024-09-15 DIAGNOSIS — E7800 Pure hypercholesterolemia, unspecified: Secondary | ICD-10-CM

## 2024-09-15 DIAGNOSIS — Z9641 Presence of insulin pump (external) (internal): Secondary | ICD-10-CM

## 2024-09-15 DIAGNOSIS — E1165 Type 2 diabetes mellitus with hyperglycemia: Secondary | ICD-10-CM

## 2024-09-15 LAB — POCT GLYCOSYLATED HEMOGLOBIN (HGB A1C): Hemoglobin A1C: 8.3 % — AB (ref 4.0–5.6)

## 2024-09-15 NOTE — Progress Notes (Signed)
 Outpatient Endocrinology Note Daniel Birmingham, MD  09/15/24   Daniel Massey 24-Oct-1990 [id number]  Referring Provider: No ref. provider found Primary Care Provider: Patient, Daniel Massey Reason for consultation: Subjective   Assessment & Plan  Diagnoses and all orders for this visit:  Uncontrolled type 2 diabetes mellitus with hyperglycemia (HCC) -     POCT glycosylated hemoglobin (Hb A1C)  Insulin  pump in place  Insulin  pump titration  Pure hypercholesterolemia   Diabetes complicated by hyperglycemia  08/2204: GAD + 5.1, Daniel c-peptide  Hba1c goal less than 7.0, current Hba1c  Lab Results  Component Value Date   HGBA1C 8.3 (A) 09/15/2024   HGBA1C 8.3 (A) 07/15/2024   HGBA1C 8.8 (A) 03/15/2024    Will recommend the following: OMNIPOD settings with Novolog  and DexCom G7:  Basal rate:  12am-9am 1.6 9am-4pm   1.4 4pm-7pm   1.1 7pm-12am 1.5  IC  12am-10:30am 1:7 10:30-3pm 1:6 10:30-3pm 1:7  ISF: 1:20, target 110 with correction over 130, IOB 4 hours  03/15/24: Instructed to bolus 15 min before meals: patient is under-bolusing carbs, only bolus 12-15 Massey meal, instructed to correctly carb counting and added DM education referral    Daniel known contraindications to any of above medications Glucagon  -sent 03/27/23  Hyperlipidemia -Last LDL off goal: 119 -not on statin -Follow low fat diet and exercise   -Blood pressure goal <140/90 - Microalbumin/creatinine at goal < 30 - on ACE/ARB Olmesartan  20mg  qd -diet changes including salt restriction -limit eating outside -counseled BP targets Massey standards of diabetes care -Uncontrolled blood pressure can lead to retinopathy, nephropathy and cardiovascular and atherosclerotic heart disease  Reviewed and counseled on: -A1C target -Blood sugar targets -Complications of uncontrolled diabetes  -Checking blood sugar before meals and bedtime and bring log next visit -All medications with mechanism of action and side  effects -Hypoglycemia management: rule of 15's, Glucagon  Emergency Kit and medical alert ID -low-carb low-fat plate-method diet -At least 20 minutes of physical activity Massey day -Annual dilated retinal eye exam and foot exam -compliance and follow up needs -follow up as scheduled or earlier if problem gets worse  Call if blood sugar is less than 70 or consistently above 250    Take a 15 gm snack of carbohydrate at bedtime before you go to sleep if your blood sugar is less than 100.    If you are going to fast after midnight for a test or procedure, ask your physician for instructions on how to reduce/decrease your insulin  dose.    Call if blood sugar is less than 70 or consistently above 250  -Treating a low sugar by rule of 15  (15 gms of sugar every 15 min until sugar is more than 70) If you feel your sugar is low, test your sugar to be sure If your sugar is low (less than 70), then take 15 grams of a fast acting Carbohydrate (3-4 glucose tablets or glucose gel or 4 ounces of juice or regular soda) Recheck your sugar 15 min after treating low to make sure it is more than 70 If sugar is still less than 70, treat again with 15 grams of carbohydrate          Don't drive the hour of hypoglycemia  If unconscious/unable to eat or drink by mouth, use glucagon  injection or nasal spray baqsimi and call 911. Can repeat again in 15 min if still unconscious.  Return in about 4 weeks (around 10/13/2024).   I have reviewed  current medications, nurse's notes, allergies, vital signs, past medical and surgical history, family medical history, and social history for this encounter. Counseled patient on symptoms, examination findings, lab findings, imaging results, treatment decisions and monitoring and prognosis. The patient understood the recommendations and agrees with the treatment plan. All questions regarding treatment plan were fully answered.  Daniel Birmingham, MD  09/15/24   History of Present  Illness Daniel Massey is a 33 y.o. year old male who presents for follow up of Type 1 diabetes mellitus.  Daniel Massey was first diagnosed in 2011.   Diabetes education +  Current diabetes regimen: OMNIPOD settings with Novolog  Basal rate:  12am-9am 1.6 9am-4pm   1.4 4pm-7pm   1.1 7pm-12am 1.5  IC  12am-10:30am 1:24 10:30-3pm 1:8 10:30-3pm 1:7  ISF: 1:40, target 110 with correction over 130, IOB 4 hours  Recent history:     Non-insulin  hypoglycemic drugs: Trulicity  3 mg weekly Previous insulin  regimen: Basaglar  50 units daily, NovoLog  10 units before meals    Previous history: Non-insulin  hypoglycemic drugs previously used: Metformin , Trulicity  since 10/22 Insulin  was started in 2012 after being on metformin  for about a year and lack of control  COMPLICATIONS -  MI/Stroke -  retinopathy, due eye exam -  neuropathy -  nephropathy  BLOOD SUGAR DATA CGM interpretation: At today's visit, we reviewed her CGM downloads. The full report is scanned in the media. Reviewing the CGM trends, blood sugars are elevated mostly all day, particularity after break fast and supper.   Physical Exam  BP 130/80   Pulse 87   Ht 6' (1.829 m)   Wt (!) 307 lb (139.3 kg)   SpO2 97%   BMI 41.64 kg/m    Constitutional: well developed, well nourished Head: normocephalic, atraumatic Eyes: sclera anicteric, Daniel redness Neck: supple Lungs: normal respiratory effort Neurology: alert and oriented Skin: dry, Daniel appreciable rashes Musculoskeletal: Daniel appreciable defects Psychiatric: normal mood and affect Diabetic Foot Exam - Simple   Daniel data filed    Current Medications Patient's Medications  New Prescriptions   Daniel medications on file  Previous Medications   AMLODIPINE -OLMESARTAN  (AZOR ) 10-20 MG TABLET    Take 1 tablet by mouth daily.   CONTINUOUS GLUCOSE SENSOR (DEXCOM G7 SENSOR) MISC    1 Device by Does not apply route as directed. Change sensor every 10 days   CONTINUOUS  GLUCOSE TRANSMITTER (DEXCOM G6 TRANSMITTER) MISC    Use as directed to check BS   GLUCAGON  (GVOKE HYPOPEN  1-PACK) 1 MG/0.2ML SOAJ    Inject 1 mg into the skin as needed (low blood sugar with impaired consciousness).   HYDROCORTISONE  2.5 % OINTMENT    Apply topically 2 (two) times daily as needed.   INSULIN  ASPART (NOVOLOG ) 100 UNIT/ML INJECTION    Up to 100 units via pump as needed   INSULIN  DISPOSABLE PUMP (OMNIPOD 5 DEXG7G6 PODS GEN 5) MISC    1 Device by Does not apply route every other day.   INSULIN  PEN NEEDLE (PEN NEEDLES) 30G X 8 MM MISC    1 each by Does not apply route daily. E11.9  Modified Medications   Daniel medications on file  Discontinued Medications   Daniel medications on file    Allergies Allergies  Allergen Reactions   Penicillins     Just didn't work Has patient had a PCN reaction causing immediate rash, facial/tongue/throat swelling, SOB or lightheadedness with hypotension: unknown Has patient had a PCN reaction causing severe rash involving mucus membranes or skin  necrosis: unknown Has patient had a PCN reaction that required hospitalization: Unknown Has patient had a PCN reaction occurring within the last 10 years: Unknown If all of the above answers are Daniel, then may proceed with Cephalosporin use.     Past Medical History Past Medical History:  Diagnosis Date   Abscess of left hand 09/12/2015   Diabetes mellitus without complication (HCC)    Hypertension     Past Surgical History Past Surgical History:  Procedure Laterality Date   I & D EXTREMITY Left 09/12/2015   Procedure: IRRIGATION AND DEBRIDEMENT LEFT RING FINGER ABSCESS;  Surgeon: Elsie Mussel, MD;  Location: [facility] OR;  Service: Orthopedics;  Laterality: Left;   ORIF ULNAR FRACTURE Right 08/2008    Family History family history includes Cancer (age of onset: 59) in his father; Diabetes in his mother and paternal grandfather; Hypertension in his father and mother.  Social History Social History    Socioeconomic History   Marital status: Married    Spouse name: [name]   Number of children: 0   Years of education: college   Highest education level: Bachelor's degree (e.g., BA, AB, BS)  Occupational History   Occupation: MLT    Comment: [employer]  Tobacco Use   Smoking status: Never   Smokeless tobacco: Never  Vaping Use   Vaping status: Never Used  Substance and Sexual Activity   Alcohol use: Yes    Alcohol/week: 2.0 standard drinks of alcohol    Types: 2 Standard drinks or equivalent Massey week   Drug use: Daniel   Sexual activity: Yes    Partners: Female  Other Topics Concern   Not on file  Social History Narrative   Graduate of [employer] with a degree in Biology.   Lives with his girlfriend, Manuel.   Family lives in [place], [place].   Social Drivers of Corporate Investment Banker Strain: Low Risk  (11/24/2023)   Overall Financial Resource Strain (CARDIA)    Difficulty of Paying Living Expenses: Not hard at all  Food Insecurity: Daniel Food Insecurity (11/24/2023)   Hunger Vital Sign    Worried About Running Out of Food in the Last Year: Never true    Ran Out of Food in the Last Year: Never true  Transportation Needs: Daniel Transportation Needs (11/24/2023)   PRAPARE - Administrator, Civil Service (Medical): Daniel    Lack of Transportation (Non-Medical): Daniel  Physical Activity: Insufficiently Active (11/24/2023)   Exercise Vital Sign    Days of Exercise Massey Week: 1 day    Minutes of Exercise Massey Session: 30 min  Stress: Daniel Stress Concern Present (11/24/2023)   [employer] of Occupational Health - Occupational Stress Questionnaire    Feeling of Stress : Not at all  Social Connections: Socially Integrated (11/24/2023)   Social Connection and Isolation Panel    Frequency of Communication with Friends and Family: Twice a week    Frequency of Social Gatherings with Friends and Family: Once a week    Attends Religious Services: More than 4 times Massey year     Active Member of Clubs or Organizations: Yes    Attends Banker Meetings: More than 4 times Massey year    Marital Status: Married  Catering Manager Violence: Not on file    Lab Results  Component Value Date   HGBA1C 8.3 (A) 09/15/2024   HGBA1C 8.3 (A) 07/15/2024   HGBA1C 8.8 (A) 03/15/2024   Lab Results  Component Value Date  CHOL 198 03/25/2023   Lab Results  Component Value Date   HDL 56.60 03/25/2023   Lab Results  Component Value Date   LDLCALC 119 (H) 03/25/2023   Lab Results  Component Value Date   TRIG 114.0 03/25/2023   Lab Results  Component Value Date   CHOLHDL 4 03/25/2023   Lab Results  Component Value Date   CREATININE 0.81 11/24/2023   Lab Results  Component Value Date   GFR 116.47 11/24/2023   Lab Results  Component Value Date   MICROALBUR <0.7 11/24/2023      Component Value Date/Time   NA 137 11/24/2023 1558   K 3.8 11/24/2023 1558   CL 103 11/24/2023 1558   CO2 25 11/24/2023 1558   GLUCOSE 222 (H) 11/24/2023 1558   BUN 13 11/24/2023 1558   CREATININE 0.81 11/24/2023 1558   CREATININE 0.95 09/04/2014 1208   CALCIUM 9.4 11/24/2023 1558   PROT 7.6 11/24/2023 1558   ALBUMIN 4.3 11/24/2023 1558   AST 21 11/24/2023 1558   ALT 21 11/24/2023 1558   ALKPHOS 86 11/24/2023 1558   BILITOT 0.8 11/24/2023 1558   GFRNONAA 37 (L) 09/16/2015 0640   GFRNONAA >89 09/04/2014 1208   GFRAA 43 (L) 09/16/2015 0640   GFRAA >89 09/04/2014 1208      Latest Ref Rng & Units 11/24/2023    3:58 PM 03/25/2023    9:22 AM 08/14/2022    8:02 AM  BMP  Glucose 70 - 99 mg/dL 777  824  848   BUN 6 - 23 mg/dL 13  14  15    Creatinine 0.40 - 1.50 mg/dL 9.18  9.06  9.17   Sodium 135 - 145 mEq/L 137  136  137   Potassium 3.5 - 5.1 mEq/L 3.8  4.1  3.7   Chloride 96 - 112 mEq/L 103  101  102   CO2 19 - 32 mEq/L 25  26  27    Calcium 8.4 - 10.5 mg/dL 9.4  9.3  9.3        Component Value Date/Time   WBC 11.4 (A) 11/29/2015 1812   WBC 16.4 (H) 09/12/2015  0844   RBC 5.31 11/29/2015 1812   RBC 5.40 09/12/2015 0844   HGB 15.3 11/29/2015 1812   HGB 15.8 09/12/2015 0844   HCT 44.5 11/29/2015 1812   HCT 46.0 09/12/2015 0844   PLT 198 09/12/2015 0844   MCV 83.8 11/29/2015 1812   MCH 28.8 11/29/2015 1812   MCH 29.3 09/12/2015 0844   MCHC 34.4 11/29/2015 1812   MCHC 34.3 09/12/2015 0844   RDW 12.6 09/12/2015 0844   LYMPHSABS 2.4 09/12/2015 0844   MONOABS 1.2 (H) 09/12/2015 0844   EOSABS 0.1 09/12/2015 0844   BASOSABS 0.0 09/12/2015 0844     Parts of this note may have been dictated using voice recognition software. There may be variances in spelling and vocabulary which are unintentional. Not all errors are proofread. Please notify the dino if any discrepancies are noted or if the meaning of any statement is not clear.

## 2024-10-18 ENCOUNTER — Encounter: Payer: Self-pay | Admitting: "Endocrinology

## 2024-10-18 ENCOUNTER — Ambulatory Visit: Admitting: "Endocrinology

## 2024-10-18 VITALS — BP 122/70 | HR 86 | Ht 72.0 in | Wt 314.0 lb

## 2024-10-18 DIAGNOSIS — Z9641 Presence of insulin pump (external) (internal): Secondary | ICD-10-CM

## 2024-10-18 DIAGNOSIS — E7800 Pure hypercholesterolemia, unspecified: Secondary | ICD-10-CM | POA: Diagnosis not present

## 2024-10-18 DIAGNOSIS — E1165 Type 2 diabetes mellitus with hyperglycemia: Secondary | ICD-10-CM

## 2024-10-18 DIAGNOSIS — Z794 Long term (current) use of insulin: Secondary | ICD-10-CM | POA: Diagnosis not present

## 2024-10-18 DIAGNOSIS — Z4681 Encounter for fitting and adjustment of insulin pump: Secondary | ICD-10-CM

## 2024-10-18 NOTE — Patient Instructions (Signed)

## 2024-10-18 NOTE — Progress Notes (Signed)
 "   Outpatient Endocrinology Note Daniel Birmingham, MD  10/18/2024   [name] [birth year] [id number]  Referring Provider: No ref. provider found Primary Care Provider: Patient, No Pcp Per Reason for consultation: Subjective   Assessment & Plan  Diagnoses and all orders for this visit:  Uncontrolled type 2 diabetes mellitus with hyperglycemia (HCC)  Insulin  pump in place  Insulin  pump titration  Pure hypercholesterolemia    Diabetes complicated by hyperglycemia  08/2204: GAD + 5.1, no c-peptide  Hba1c goal less than 7.0, current Hba1c  Lab Results  Component Value Date   HGBA1C 8.3 (A) 09/15/2024   HGBA1C 8.3 (A) 07/15/2024   HGBA1C 8.8 (A) 03/15/2024    Will recommend the following: OMNIPOD settings with Novolog  and DexCom G7:  Basal rate:  12am-6am 1.75 6am-4pm   1.3 4pm-7pm   1.1 7pm-12am 1.5  IC  12am-11:30am 1:7 11:30-3pm 1:6 10:30-3pm 1:7  ISF: 1:20, target 110 with correction over 130, IOB 4 hours  03/15/24: Instructed to bolus 15 min before meals: patient is under-bolusing carbs, only bolus 12-15 per meal, instructed to correctly carb counting and added DM education referral    No known contraindications to any of above medications Glucagon  -sent 03/27/23  Hyperlipidemia -Last LDL off goal: 119 -not on statin -Follow low fat diet and exercise   -Blood pressure goal <140/90 - Microalbumin/creatinine at goal < 30 - on ACE/ARB Olmesartan  20mg  qd -diet changes including salt restriction -limit eating outside -counseled BP targets per standards of diabetes care -Uncontrolled blood pressure can lead to retinopathy, nephropathy and cardiovascular and atherosclerotic heart disease  Reviewed and counseled on: -A1C target -Blood sugar targets -Complications of uncontrolled diabetes  -Checking blood sugar before meals and bedtime and bring log next visit -All medications with mechanism of action and side effects -Hypoglycemia management: rule of  15's, Glucagon  Emergency Kit and medical alert ID -low-carb low-fat plate-method diet -At least 20 minutes of physical activity per day -Annual dilated retinal eye exam and foot exam -compliance and follow up needs -follow up as scheduled or earlier if problem gets worse  Call if blood sugar is less than 70 or consistently above 250    Take a 15 gm snack of carbohydrate at bedtime before you go to sleep if your blood sugar is less than 100.    If you are going to fast after midnight for a test or procedure, ask your physician for instructions on how to reduce/decrease your insulin  dose.    Call if blood sugar is less than 70 or consistently above 250  -Treating a low sugar by rule of 15  (15 gms of sugar every 15 min until sugar is more than 70) If you feel your sugar is low, test your sugar to be sure If your sugar is low (less than 70), then take 15 grams of a fast acting Carbohydrate (3-4 glucose tablets or glucose gel or 4 ounces of juice or regular soda) Recheck your sugar 15 min after treating low to make sure it is more than 70 If sugar is still less than 70, treat again with 15 grams of carbohydrate          Don't drive the hour of hypoglycemia  If unconscious/unable to eat or drink by mouth, use glucagon  injection or nasal spray baqsimi and call 911. Can repeat again in 15 min if still unconscious.  Return in about 2 months (around 12/30/2024).   I have reviewed current medications, nurse's notes, allergies, vital signs, past  medical and surgical history, family medical history, and social history for this encounter. Counseled patient on symptoms, examination findings, lab findings, imaging results, treatment decisions and monitoring and prognosis. The patient understood the recommendations and agrees with the treatment plan. All questions regarding treatment plan were fully answered.  Daniel Birmingham, MD  10/18/2024   History of Present Illness Daniel Massey is a 34 y.o. year  old male who presents for follow up of Type 1 diabetes mellitus.  Daniel Massey was first diagnosed in 2011.   Diabetes education +  Current diabetes regimen: OMNIPOD settings with Novolog  Basal rate:  12am-9am 1.6 9am-4pm   1.4 4pm-7pm   1.1 7pm-12am 1.5  IC  12am-10:30am 1:24 10:30-3pm 1:8 10:30-3pm 1:7  ISF: 1:40, target 110 with correction over 130, IOB 4 hours  Recent history:     Non-insulin  hypoglycemic drugs: Trulicity  3 mg weekly Previous insulin  regimen: Basaglar  50 units daily, NovoLog  10 units before meals    Previous history: Non-insulin  hypoglycemic drugs previously used: Metformin , Trulicity  since 10/22 Insulin  was started in 2012 after being on metformin  for about a year and lack of control  COMPLICATIONS -  MI/Stroke -  retinopathy, due eye exam -  neuropathy -  nephropathy  BLOOD SUGAR DATA CGM interpretation: At today's visit, we reviewed her CGM downloads. The full report is scanned in the media. Reviewing the CGM trends, blood sugars are elevated overnight and after lunch in afternoon, and improve in between.  Physical Exam  BP 122/70   Pulse 86   Ht 6' (1.829 m)   Wt (!) 314 lb (142.4 kg)   SpO2 96%   BMI 42.59 kg/m    Constitutional: well developed, well nourished Head: normocephalic, atraumatic Eyes: sclera anicteric, no redness Neck: supple Lungs: normal respiratory effort Neurology: alert and oriented Skin: dry, no appreciable rashes Musculoskeletal: no appreciable defects Psychiatric: normal mood and affect Diabetic Foot Exam - Simple   No data filed    Current Medications Patient's Medications  New Prescriptions   No medications on file  Previous Medications   AMLODIPINE -OLMESARTAN  (AZOR ) 10-20 MG TABLET    Take 1 tablet by mouth daily.   CONTINUOUS GLUCOSE SENSOR (DEXCOM G7 SENSOR) MISC    1 Device by Does not apply route as directed. Change sensor every 10 days   CONTINUOUS GLUCOSE TRANSMITTER (DEXCOM G6 TRANSMITTER)  MISC    Use as directed to check BS   GLUCAGON  (GVOKE HYPOPEN  1-PACK) 1 MG/0.2ML SOAJ    Inject 1 mg into the skin as needed (low blood sugar with impaired consciousness).   HYDROCORTISONE  2.5 % OINTMENT    Apply topically 2 (two) times daily as needed.   INSULIN  ASPART (NOVOLOG ) 100 UNIT/ML INJECTION    Up to 100 units via pump as needed   INSULIN  DISPOSABLE PUMP (OMNIPOD 5 DEXG7G6 PODS GEN 5) MISC    1 Device by Does not apply route every other day.   INSULIN  PEN NEEDLE (PEN NEEDLES) 30G X 8 MM MISC    1 each by Does not apply route daily. E11.9  Modified Medications   No medications on file  Discontinued Medications   No medications on file    Allergies Allergies  Allergen Reactions   Penicillins     Just didn't work Has patient had a PCN reaction causing immediate rash, facial/tongue/throat swelling, SOB or lightheadedness with hypotension: unknown Has patient had a PCN reaction causing severe rash involving mucus membranes or skin necrosis: unknown Has patient had a PCN reaction  that required hospitalization: Unknown Has patient had a PCN reaction occurring within the last 10 years: Unknown If all of the above answers are NO, then may proceed with Cephalosporin use.     Past Medical History Past Medical History:  Diagnosis Date   Abscess of left hand 09/12/2015   Diabetes mellitus without complication (HCC)    Hypertension     Past Surgical History Past Surgical History:  Procedure Laterality Date   I & D EXTREMITY Left 09/12/2015   Procedure: IRRIGATION AND DEBRIDEMENT LEFT RING FINGER ABSCESS;  Surgeon: Elsie Mussel, MD;  Location: [facility] OR;  Service: Orthopedics;  Laterality: Left;   ORIF ULNAR FRACTURE Right 08/2008    Family History family history includes Cancer (age of onset: 89) in his father; Diabetes in his mother and paternal grandfather; Hypertension in his father and mother.  Social History Social History   Socioeconomic History   Marital status:  Married    Spouse name: [name]   Number of children: 0   Years of education: college   Highest education level: Bachelor's degree (e.g., BA, AB, BS)  Occupational History   Occupation: MLT    Comment: [employer]  Tobacco Use   Smoking status: Never   Smokeless tobacco: Never  Vaping Use   Vaping status: Never Used  Substance and Sexual Activity   Alcohol use: Yes    Alcohol/week: 2.0 standard drinks of alcohol    Types: 2 Standard drinks or equivalent per week   Drug use: No   Sexual activity: Yes    Partners: Female  Other Topics Concern   Not on file  Social History Narrative   Graduate of [employer] with a degree in Biology.   Lives with his girlfriend, Manuel.   Family lives in [place], [place].   Social Drivers of Health   Tobacco Use: Low Risk (10/18/2024)   Patient History    Smoking Tobacco Use: Never    Smokeless Tobacco Use: Never    Passive Exposure: Not on file  Financial Resource Strain: Low Risk (11/24/2023)   Overall Financial Resource Strain (CARDIA)    Difficulty of Paying Living Expenses: Not hard at all  Food Insecurity: No Food Insecurity (11/24/2023)   Hunger Vital Sign    Worried About Running Out of Food in the Last Year: Never true    Ran Out of Food in the Last Year: Never true  Transportation Needs: No Transportation Needs (11/24/2023)   PRAPARE - Administrator, Civil Service (Medical): No    Lack of Transportation (Non-Medical): No  Physical Activity: Insufficiently Active (11/24/2023)   Exercise Vital Sign    Days of Exercise per Week: 1 day    Minutes of Exercise per Session: 30 min  Stress: No Stress Concern Present (11/24/2023)   [employer] of Occupational Health - Occupational Stress Questionnaire    Feeling of Stress : Not at all  Social Connections: Socially Integrated (11/24/2023)   Social Connection and Isolation Panel    Frequency of Communication with Friends and Family: Twice a week    Frequency of Social  Gatherings with Friends and Family: Once a week    Attends Religious Services: More than 4 times per year    Active Member of Clubs or Organizations: Yes    Attends Banker Meetings: More than 4 times per year    Marital Status: Married  Catering Manager Violence: Not on file  Depression (PHQ2-9): Low Risk (11/24/2023)   Depression (PHQ2-9)  PHQ-2 Score: 0  Alcohol Screen: Low Risk (11/24/2023)   Alcohol Screen    Last Alcohol Screening Score (AUDIT): 5  Housing: Low Risk (11/24/2023)   Housing Stability Vital Sign    Unable to Pay for Housing in the Last Year: No    Number of Times Moved in the Last Year: 0    Homeless in the Last Year: No  Utilities: Not on file  Health Literacy: Not on file    Lab Results  Component Value Date   HGBA1C 8.3 (A) 09/15/2024   HGBA1C 8.3 (A) 07/15/2024   HGBA1C 8.8 (A) 03/15/2024   Lab Results  Component Value Date   CHOL 198 03/25/2023   Lab Results  Component Value Date   HDL 56.60 03/25/2023   Lab Results  Component Value Date   LDLCALC 119 (H) 03/25/2023   Lab Results  Component Value Date   TRIG 114.0 03/25/2023   Lab Results  Component Value Date   CHOLHDL 4 03/25/2023   Lab Results  Component Value Date   CREATININE 0.81 11/24/2023   Lab Results  Component Value Date   GFR 116.47 11/24/2023   Lab Results  Component Value Date   MICROALBUR <0.7 11/24/2023      Component Value Date/Time   NA 137 11/24/2023 1558   K 3.8 11/24/2023 1558   CL 103 11/24/2023 1558   CO2 25 11/24/2023 1558   GLUCOSE 222 (H) 11/24/2023 1558   BUN 13 11/24/2023 1558   CREATININE 0.81 11/24/2023 1558   CREATININE 0.95 09/04/2014 1208   CALCIUM 9.4 11/24/2023 1558   PROT 7.6 11/24/2023 1558   ALBUMIN 4.3 11/24/2023 1558   AST 21 11/24/2023 1558   ALT 21 11/24/2023 1558   ALKPHOS 86 11/24/2023 1558   BILITOT 0.8 11/24/2023 1558   GFRNONAA 37 (L) 09/16/2015 0640   GFRNONAA >89 09/04/2014 1208   GFRAA 43 (L) 09/16/2015  0640   GFRAA >89 09/04/2014 1208      Latest Ref Rng & Units 11/24/2023    3:58 PM 03/25/2023    9:22 AM 08/14/2022    8:02 AM  BMP  Glucose 70 - 99 mg/dL 777  824  848   BUN 6 - 23 mg/dL 13  14  15    Creatinine 0.40 - 1.50 mg/dL 9.18  9.06  9.17   Sodium 135 - 145 mEq/L 137  136  137   Potassium 3.5 - 5.1 mEq/L 3.8  4.1  3.7   Chloride 96 - 112 mEq/L 103  101  102   CO2 19 - 32 mEq/L 25  26  27    Calcium 8.4 - 10.5 mg/dL 9.4  9.3  9.3        Component Value Date/Time   WBC 11.4 (A) 11/29/2015 1812   WBC 16.4 (H) 09/12/2015 0844   RBC 5.31 11/29/2015 1812   RBC 5.40 09/12/2015 0844   HGB 15.3 11/29/2015 1812   HGB 15.8 09/12/2015 0844   HCT 44.5 11/29/2015 1812   HCT 46.0 09/12/2015 0844   PLT 198 09/12/2015 0844   MCV 83.8 11/29/2015 1812   MCH 28.8 11/29/2015 1812   MCH 29.3 09/12/2015 0844   MCHC 34.4 11/29/2015 1812   MCHC 34.3 09/12/2015 0844   RDW 12.6 09/12/2015 0844   LYMPHSABS 2.4 09/12/2015 0844   MONOABS 1.2 (H) 09/12/2015 0844   EOSABS 0.1 09/12/2015 0844   BASOSABS 0.0 09/12/2015 0844     Parts of this note may have been dictated  using voice recognition software. There may be variances in spelling and vocabulary which are unintentional. Not all errors are proofread. Please notify the dino if any discrepancies are noted or if the meaning of any statement is not clear.   "

## 2024-10-19 ENCOUNTER — Encounter: Payer: Self-pay | Admitting: "Endocrinology

## 2024-11-18 ENCOUNTER — Other Ambulatory Visit: Payer: Self-pay | Admitting: "Endocrinology

## 2024-11-18 DIAGNOSIS — E1065 Type 1 diabetes mellitus with hyperglycemia: Secondary | ICD-10-CM

## 2024-12-16 ENCOUNTER — Encounter

## 2025-01-03 ENCOUNTER — Ambulatory Visit: Admitting: "Endocrinology
